# Patient Record
Sex: Female | Born: 1985 | Race: White | Hispanic: No | Marital: Married | Smoking: Never smoker
Health system: Southern US, Community
[De-identification: ages and names within clinical notes are randomized; demographics above are authoritative.]

## PROBLEM LIST (undated history)

## (undated) ENCOUNTER — Inpatient Hospital Stay (HOSPITAL_COMMUNITY): Payer: Self-pay

## (undated) DIAGNOSIS — Z9889 Other specified postprocedural states: Secondary | ICD-10-CM

## (undated) DIAGNOSIS — F32A Depression, unspecified: Secondary | ICD-10-CM

## (undated) DIAGNOSIS — O139 Gestational [pregnancy-induced] hypertension without significant proteinuria, unspecified trimester: Secondary | ICD-10-CM

## (undated) DIAGNOSIS — D649 Anemia, unspecified: Secondary | ICD-10-CM

## (undated) DIAGNOSIS — E049 Nontoxic goiter, unspecified: Secondary | ICD-10-CM

## (undated) DIAGNOSIS — F419 Anxiety disorder, unspecified: Secondary | ICD-10-CM

## (undated) DIAGNOSIS — R112 Nausea with vomiting, unspecified: Secondary | ICD-10-CM

## (undated) HISTORY — DX: Gestational (pregnancy-induced) hypertension without significant proteinuria, unspecified trimester: O13.9

## (undated) HISTORY — PX: DILATION AND CURETTAGE OF UTERUS: SHX78

## (undated) HISTORY — DX: Depression, unspecified: F32.A

## (undated) HISTORY — DX: Nontoxic goiter, unspecified: E04.9

## (undated) HISTORY — PX: LAPAROSCOPY FOR ECTOPIC PREGNANCY: SUR765

---

## 2005-05-24 ENCOUNTER — Observation Stay (HOSPITAL_COMMUNITY): Admission: AD | Admit: 2005-05-24 | Discharge: 2005-05-25 | Payer: Self-pay | Admitting: Obstetrics and Gynecology

## 2005-05-27 ENCOUNTER — Inpatient Hospital Stay (HOSPITAL_COMMUNITY): Admission: AD | Admit: 2005-05-27 | Discharge: 2005-05-27 | Payer: Self-pay | Admitting: Obstetrics and Gynecology

## 2005-05-27 ENCOUNTER — Encounter (INDEPENDENT_AMBULATORY_CARE_PROVIDER_SITE_OTHER): Payer: Self-pay | Admitting: *Deleted

## 2005-05-30 ENCOUNTER — Inpatient Hospital Stay (HOSPITAL_COMMUNITY): Admission: AD | Admit: 2005-05-30 | Discharge: 2005-05-30 | Payer: Self-pay | Admitting: Obstetrics & Gynecology

## 2005-06-03 ENCOUNTER — Encounter (INDEPENDENT_AMBULATORY_CARE_PROVIDER_SITE_OTHER): Payer: Self-pay | Admitting: *Deleted

## 2005-06-03 ENCOUNTER — Ambulatory Visit (HOSPITAL_COMMUNITY): Admission: RE | Admit: 2005-06-03 | Discharge: 2005-06-03 | Payer: Self-pay | Admitting: Obstetrics and Gynecology

## 2005-07-01 HISTORY — PX: ECTOPIC PREGNANCY SURGERY: SHX613

## 2006-04-28 ENCOUNTER — Ambulatory Visit (HOSPITAL_COMMUNITY): Admission: AD | Admit: 2006-04-28 | Discharge: 2006-04-28 | Payer: Self-pay | Admitting: Obstetrics & Gynecology

## 2006-06-29 ENCOUNTER — Inpatient Hospital Stay (HOSPITAL_COMMUNITY): Admission: AD | Admit: 2006-06-29 | Discharge: 2006-06-30 | Payer: Self-pay | Admitting: Obstetrics and Gynecology

## 2006-06-29 ENCOUNTER — Inpatient Hospital Stay (HOSPITAL_COMMUNITY): Admission: AD | Admit: 2006-06-29 | Discharge: 2006-06-29 | Payer: Self-pay | Admitting: Obstetrics and Gynecology

## 2006-07-01 HISTORY — PX: APPENDECTOMY: SHX54

## 2006-07-08 ENCOUNTER — Inpatient Hospital Stay (HOSPITAL_COMMUNITY): Admission: RE | Admit: 2006-07-08 | Discharge: 2006-07-09 | Payer: Self-pay | Admitting: Obstetrics and Gynecology

## 2009-01-31 ENCOUNTER — Ambulatory Visit (HOSPITAL_COMMUNITY): Admission: RE | Admit: 2009-01-31 | Discharge: 2009-01-31 | Payer: Self-pay | Admitting: Radiology

## 2009-02-01 ENCOUNTER — Ambulatory Visit (HOSPITAL_COMMUNITY): Admission: RE | Admit: 2009-02-01 | Discharge: 2009-02-01 | Payer: Self-pay | Admitting: Obstetrics and Gynecology

## 2009-02-01 ENCOUNTER — Encounter (INDEPENDENT_AMBULATORY_CARE_PROVIDER_SITE_OTHER): Payer: Self-pay | Admitting: Obstetrics and Gynecology

## 2010-02-14 ENCOUNTER — Ambulatory Visit (HOSPITAL_COMMUNITY): Admission: RE | Admit: 2010-02-14 | Discharge: 2010-02-14 | Payer: Self-pay | Admitting: Obstetrics and Gynecology

## 2010-10-06 LAB — CBC
HCT: 39.9 % (ref 36.0–46.0)
Hemoglobin: 13.6 g/dL (ref 12.0–15.0)
MCHC: 34.2 g/dL (ref 30.0–36.0)
MCV: 90.5 fL (ref 78.0–100.0)
Platelets: 193 10*3/uL (ref 150–400)
RBC: 4.4 MIL/uL (ref 3.87–5.11)
RDW: 13.2 % (ref 11.5–15.5)
WBC: 6.8 10*3/uL (ref 4.0–10.5)

## 2010-10-06 LAB — RH IMMUNE GLOBULIN WORKUP (NOT WOMEN'S HOSP)
ABO/RH(D): B NEG
Antibody Screen: NEGATIVE

## 2010-10-28 LAB — OB RESULTS CONSOLE HIV ANTIBODY (ROUTINE TESTING): HIV: NONREACTIVE

## 2010-11-13 NOTE — Op Note (Signed)
NAMEERI, MCEVERS             ACCOUNT NO.:  [id number]   MEDICAL RECORD NO.:  [id number]          PATIENT TYPE:  AMB   LOCATION:  SDC                           FACILITY:  [facility]   PHYSICIAN:  Carrington Clamp, M.D. DATE OF BIRTH:  05/15/1986   DATE OF PROCEDURE:  02/01/2009  DATE OF DISCHARGE:                               OPERATIVE REPORT   PREOPERATIVE DIAGNOSIS:  Missed abortion.   POSTOPERATIVE DIAGNOSIS:  Missed abortion.   PROCEDURE:  Dilation and evacuation.   SURGEON:  Carrington Clamp, MD   ASSISTANTS:  None.   ANESTHESIA:  MAC.   FINDINGS:  A 7-week size uterus down to 6 weeks with good cry.   SPECIMEN:  Uterine contents.   DISPOSITION:  To Pathology.   ESTIMATED BLOOD LOSS:  Minimal.   IV FLUIDS:  800 mL.   URINE OUTPUT:  Not measured.   COMPLICATIONS:  None.   MEDICATIONS:  Methergine.   COUNTS:  Correct x3.   TECHNIQUE:  After adequate MAC anesthesia was achieved, the patient was  prepped and draped unremarkable usual sterile fashion in lithotomy  position.  A red rubber catheter was used to empty the bladder, and a  speculum placed in the vagina.  Single-tooth tenaculum was used to  stabilize the cervix.  The cervix was dilated with Shawnie Pons dilators.  An 8-  mm suction curette was placed inside the cavity, and suction curettage  performed without  complication.  Alternating suction curettage and sharp curettage was  used to ensure good cry.  The patient was given IM Methergine, and all  instruments were withdrawn from that the vagina.  The patient tolerated  the procedure well and was returned to recovery in stable condition.      Carrington Clamp, M.D.  Electronically Signed     MH/MEDQ  D:  02/01/2009  T:  02/01/2009  Job:  [id number]

## 2010-11-16 NOTE — Discharge Summary (Signed)
Christine Greene, Christine Greene             ACCOUNT NO.:  [id number]   MEDICAL RECORD NO.:  [id number]          PATIENT TYPE:  INP   LOCATION:  9163                          FACILITY:  [facility]   PHYSICIAN:  Malva Limes, M.D.    DATE OF BIRTH:  11-Mar-1986   DATE OF ADMISSION:  06/29/2006  DATE OF DISCHARGE:  06/30/2006                               DISCHARGE SUMMARY   FINAL DIAGNOSIS:  Intrauterine pregnancy at [redacted] weeks gestation,  threatened labor.   COMPLICATIONS:  None.   This 25 year old G3, P0, 0-2-0 presents at [redacted] weeks gestation in  questionable labor.  She was admitted the evening before, but had no  change in her cervix.  That was felt 2-3 cm dilation upon admission.  She had been in labor and delivery for about 8 hours now and still no  change in cervix, having irregular contractions.  Fetal heart tracing  was nice and reactive without decelerations.  Her antepartum course up  to this point had been uncomplicated.  At this point when there was no  change in the cervix a discussion was held with the patient and her  husband that she is not actually in active labor and Dr. Dareen Piano did  not want to induce at 37 weeks secondary to chance of pulmonary  immaturity.  At this point the patient was allowed to give it another  couple of hours to see there is was any cervical change and there was  not.  She was sent home with some Darvocet and some Ambien to help her  sleep.  Labor precautions were reviewed and she was to follow up with  her next appointment in our office that was already scheduled.   LABS ON DISCHARGE:  The patient had a hemoglobin of 14.4, white blood  cell count of 10.8.      Leilani Able, P.A.-C.    ______________________________  Malva Limes, M.D.    MB/MEDQ  D:  07/28/2006  T:  07/28/2006  Job:  [id number]

## 2010-11-16 NOTE — Op Note (Signed)
NAMEHILLARY, Christine Greene             ACCOUNT NO.:  [id number]   MEDICAL RECORD NO.:  [id number]          PATIENT TYPE:  AMB   LOCATION:  SDC                           [facility]ILITY:  [facility]   PHYSICIAN:  Carrington Clamp, M.D. DATE OF BIRTH:  24-Nov-1985   DATE OF PROCEDURE:  06/03/2005  DATE OF DISCHARGE:                                 OPERATIVE REPORT   PREOPERATIVE DIAGNOSIS:  Ruptured ectopic pregnancy.   POSTOPERATIVE DIAGNOSIS:  Left ectopic pregnancy, ruptured.   PROCEDURES:  Diagnostic laparoscopy with left salpingectomy and irrigation  of abdomen.   SURGEON:  Carrington Clamp, M.D.   ASSISTANT:  Luvenia Redden, M.D.   ANESTHESIA:  Is general.   SPECIMENS:  Left tube.   ESTIMATED BLOOD LOSS:  Minimal during surgery however there was  approximately 600 mL of blood and clot intra-abdominally upon initiation of  the case secondary to the rupture of the ectopic.   IV FLUIDS:  Were 800 mL.   URINE OUTPUT:  Was 50 mL.   COMPLICATIONS:  Were none.   FINDINGS:  Were grossly ruptured left tube. The rupture extended probably  about 3 cm and the bed of the tube was continuing to bleed. Medial edge of  the tube also appeared to be slightly necrotic. Ia suspected that the  ectopic was actually distal to the rupture as there was a continued bulge  distal to the rupture. There was normal right tube and normal ovaries  bilaterally. Counts were correct x2.   MEDICATIONS:  Were none.   TECHNIQUE:  After adequate general anesthesia was achieved, the patient  prepped, draped in sterile fashion in dorsal lithotomy position. A Foley was  placed in the bladder and speculum was placed in the vagina. A uterine  manipulator placed inside the cervix. Speculum was removed. Attention was  turned to the abdomen where an infraumbilical incision of 2 cm made with the  scalpel. The Veress needle was passed into the abdomen without aspiration of  bowel contents or blood and the abdomen insufflated. 10  mm trocar was then  placed into the abdomen without complication and 10 mm scope placed after  that. Two 5 mm incisions were made on either side of the insertion of the  round ligament under direct visualization and 5 mm trocars were placed under  direct visualization the camera. The abdomen was irrigated and as much clot  and blood was attempted to be removed as possible. The left tubal rupture  was identified and after inspecting carefully, it was decided that it was  impossible save the tube. Salpingectomy proceeded with the tripolar cautery  medial to the edge of the rupture but distal to the cornua. The cautery  proceeded along the mesosalpinx, removing the entire tube. Hemostasis was  achieved and the ovary was left alone intact.   More irrigation was performed and the incision lines found to be hemostatic.  All instruments were then withdrawn from the abdomen and the abdomen  desufflated. The fascial incision of the 10 mm incision was closed with a  figure-of-eight stitch of 2-0 Vicryl. 3-0 Vicryl Rapide was used to  close  the smaller skin incisions in subcuticular fashion. The skin incision then  closed with Dermabond. The patient tolerated the procedure well and was  returned to recovery room in stable condition.      Carrington Clamp, M.D.  Electronically Signed     MH/MEDQ  D:  06/03/2005  T:  06/03/2005  Job:  [id number]

## 2011-07-02 NOTE — L&D Delivery Note (Signed)
Pt had Pitocin augmentation after she had SROM. She completed the first stage without difficulty. When complete she was taken to the OR, Second stage was brief. She delivered Twin A, a boy, in the LOA position. Twin B, a girl, was delivered in the ROP position, Pt had a 2nd degree midline tear. Placenta S/I. Tear closed with 3-0 chromic. EBL-400cc. Babies to NICU. No complications.

## 2011-10-25 ENCOUNTER — Inpatient Hospital Stay (HOSPITAL_COMMUNITY)
Admission: AD | Admit: 2011-10-25 | Discharge: 2011-10-27 | DRG: 379 | Disposition: A | Discharge status: Home / Self Care | Payer: BC Managed Care – PPO | Source: Ambulatory Visit | Attending: Obstetrics & Gynecology | Admitting: Obstetrics & Gynecology

## 2011-10-25 ENCOUNTER — Encounter (HOSPITAL_COMMUNITY): Payer: Self-pay | Admitting: *Deleted

## 2011-10-25 DIAGNOSIS — O4700 False labor before 37 completed weeks of gestation, unspecified trimester: Principal | ICD-10-CM | POA: Diagnosis present

## 2011-10-25 DIAGNOSIS — O30009 Twin pregnancy, unspecified number of placenta and unspecified number of amniotic sacs, unspecified trimester: Secondary | ICD-10-CM | POA: Diagnosis present

## 2011-10-25 DIAGNOSIS — O30049 Twin pregnancy, dichorionic/diamniotic, unspecified trimester: Secondary | ICD-10-CM

## 2011-10-25 LAB — FETAL FIBRONECTIN: Fetal Fibronectin: POSITIVE — AB

## 2011-10-25 MED ORDER — BETAMETHASONE SOD PHOS & ACET 6 (3-3) MG/ML IJ SUSP
12.0000 mg | INTRAMUSCULAR | Status: AC
  Administered 2011-10-25 – 2011-10-26 (×2): 12 mg via INTRAMUSCULAR
  Filled 2011-10-25 (×2): qty 2

## 2011-10-25 MED ORDER — LACTATED RINGERS IV SOLN
INTRAVENOUS | Status: DC
  Administered 2011-10-25 – 2011-10-27 (×5): via INTRAVENOUS

## 2011-10-25 MED ORDER — MAGNESIUM SULFATE BOLUS VIA INFUSION
6.0000 g | Freq: Once | INTRAVENOUS | Status: AC
  Administered 2011-10-25: 6 g via INTRAVENOUS
  Filled 2011-10-25: qty 500

## 2011-10-25 MED ORDER — DOCUSATE SODIUM 100 MG PO CAPS
100.0000 mg | ORAL_CAPSULE | Freq: Every day | ORAL | Status: DC
  Administered 2011-10-26 – 2011-10-27 (×2): 100 mg via ORAL
  Filled 2011-10-25 (×2): qty 1

## 2011-10-25 MED ORDER — CALCIUM CARBONATE ANTACID 500 MG PO CHEW
2.0000 | CHEWABLE_TABLET | ORAL | Status: DC | PRN
  Administered 2011-10-26 (×2): 400 mg via ORAL
  Filled 2011-10-25 (×2): qty 2

## 2011-10-25 MED ORDER — PRENATAL MULTIVITAMIN CH
1.0000 | ORAL_TABLET | Freq: Every day | ORAL | Status: DC
  Administered 2011-10-26 – 2011-10-27 (×2): 1 via ORAL
  Filled 2011-10-25 (×2): qty 1

## 2011-10-25 MED ORDER — SODIUM CHLORIDE 0.9 % IV SOLN
2.0000 g | Freq: Four times a day (QID) | INTRAVENOUS | Status: DC
  Administered 2011-10-25 – 2011-10-27 (×8): 2 g via INTRAVENOUS
  Filled 2011-10-25 (×8): qty 2000

## 2011-10-25 MED ORDER — BUTORPHANOL TARTRATE 2 MG/ML IJ SOLN
1.0000 mg | Freq: Once | INTRAMUSCULAR | Status: AC
  Administered 2011-10-26: 1 mg via INTRAVENOUS
  Filled 2011-10-25: qty 1

## 2011-10-25 MED ORDER — PANTOPRAZOLE SODIUM 40 MG PO TBEC
80.0000 mg | DELAYED_RELEASE_TABLET | Freq: Every day | ORAL | Status: DC
  Administered 2011-10-26 – 2011-10-27 (×2): 80 mg via ORAL
  Filled 2011-10-25 (×3): qty 2

## 2011-10-25 MED ORDER — ACETAMINOPHEN 325 MG PO TABS
650.0000 mg | ORAL_TABLET | ORAL | Status: DC | PRN
  Administered 2011-10-25 – 2011-10-27 (×5): 650 mg via ORAL
  Filled 2011-10-25 (×5): qty 2

## 2011-10-25 MED ORDER — ZOLPIDEM TARTRATE 10 MG PO TABS
10.0000 mg | ORAL_TABLET | Freq: Every evening | ORAL | Status: DC | PRN
  Administered 2011-10-25 – 2011-10-26 (×2): 10 mg via ORAL
  Filled 2011-10-25 (×2): qty 1

## 2011-10-25 MED ORDER — MAGNESIUM SULFATE 40 G IN LACTATED RINGERS - SIMPLE
3.0000 g/h | INTRAVENOUS | Status: DC
  Administered 2011-10-26: 3 g/h via INTRAVENOUS
  Filled 2011-10-25 (×2): qty 500

## 2011-10-25 MED ORDER — ONDANSETRON 8 MG PO TBDP
8.0000 mg | ORAL_TABLET | Freq: Three times a day (TID) | ORAL | Status: DC | PRN
  Administered 2011-10-25 – 2011-10-26 (×3): 8 mg via ORAL
  Filled 2011-10-25 (×2): qty 1

## 2011-10-25 NOTE — Consult Note (Signed)
[facility]  Neonatal Medicine Consultation       10/25/2011    9:34 PM  I was called at the request of the patient's obstetrician (Dr. Arlyce Dice) to speak to this patient due to preterm labor at 32 weeks.  Per Dr. Marzetta Board note:  "26 y.o. [id number] Estimated Date of Delivery: 12/20/11 admitted at [redacted] weeks gestation for preterm labor.   Prenatal course was complicated by Di/Di Twin gestation  Prenatal labs: Blood Type:B-(Received RhoGAM at 28 weeks). Screening tests for HIV, Syphilis, Hepatitis B, Rubella sensitivity, fetal anomalies, and gestational diabetes were negative. "  I spoke with the patient regarding expectations for 32-35 week newborns.  We anticipate the babies (female and female) will require NICU care, but have an uncomplicated hospital course and good outcome.  I reviewed respiratory and feeding issues, temperature support, length of stay.  She plans to breast feed the twins, and I discussed the breast pump she will need to use for several weeks.  _____________________ Electronically Signed By: Angelita Ingles, MD Neonatologist

## 2011-10-25 NOTE — H&P (Signed)
  26 y.o. [id number]  Estimated Date of Delivery: 12/20/11 admitted at32 weeks gestation for preterm labor. Prenatal course was complicated by Di/Di Twin gestation Prenatal labs: Blood Type:B-(Received RhoGAM at 28 weeks).  Screening tests for HIV, Syphilis, Hepatitis B, Rubella sensitivity, fetal anomalies, and gestational diabetes were negative.    Afebrile, VSS Heart and Lungs: No active disease Abdomen: soft, gravid, Twins AGA. Cervical exam:  3/60.  Impression: Preterm cervical change in twin gestation  Plan:  Steroids, antibiotics, magnesium tocolysis and cerebral palsy prophylaxis if contractions are noted.

## 2011-10-26 MED ORDER — MAGNESIUM SULFATE 40 G IN LACTATED RINGERS - SIMPLE
2.5000 g/h | INTRAVENOUS | Status: DC
  Filled 2011-10-26: qty 500

## 2011-10-26 NOTE — Progress Notes (Addendum)
Patient was begun on IV Magnesium shortly after admission last night due to moderate uterine contractions occuring every 3 - 4 minutes.  Her uterine activity has decreased on magnesium 3 gms/hour.  Reflexes remain active.  Currently she is having about 3 mild contractions per hour.  The twins show no significant FHT abnormalities.  Plan:  Change hospital status from observation to in patient.            Continue magnesium tocolysis.

## 2011-10-26 NOTE — Progress Notes (Signed)
Magnesium sulfate decreased to 2gm/hr. 

## 2011-10-26 NOTE — Progress Notes (Signed)
1615- magnesium sulfate decreased to 1.5 grams per hour.

## 2011-10-26 NOTE — Progress Notes (Signed)
0830 magnesium sulfate decreased to 2.5gm/hr as ordered by dr. Arlyce Dice

## 2011-10-26 NOTE — Progress Notes (Signed)
Unable to definitively distinguish baby b fhr & maternal hr via external cardio and maternal pulse ox by myself and Ruthe F., RN- called Dr. Arlyce Dice to perform bedside u/s.  Dr. Arlyce Dice in pt's room @ 0045, bedside u/s performed, fhr present x 2; baby a vtx on maternal Lt with fhr 120s, baby b vtx on maternal Rt with fhr low 100's- correlating with maternal hr. Mom & husband reassured of fetal wellbeing x 2 by Dr. Arlyce Dice. Cheral Marker

## 2011-10-27 MED ORDER — NIFEDIPINE 10 MG PO CAPS
20.0000 mg | ORAL_CAPSULE | Freq: Three times a day (TID) | ORAL | Status: DC
  Administered 2011-10-27: 20 mg via ORAL
  Filled 2011-10-27: qty 2

## 2011-10-27 NOTE — Discharge Instructions (Signed)
Preventing Preterm Labor Preterm labor is when a pregnant woman has contractions that cause the cervix to open, shorten, and thin before 37 weeks of pregnancy. You will have regular contractions (tightening) 2 to 3 minutes apart. This usually causes discomfort or pain. HOME CARE  Eat a healthy diet.   Take your vitamins as told by your doctor.   Drink enough fluids to keep your pee (urine) clear or pale yellow every day.   Get rest and sleep.   Do not have sex if you are at high risk for preterm labor.   Follow your doctor's advice about activity, medicines, and tests.   Avoid stress.   Avoid hard labor or exercise that lasts for a long time.   Do not smoke.  GET HELP RIGHT AWAY IF:   You are having contractions.   You have belly (abdominal) pain.   You have bleeding from your vagina.   You have pain when you pee (urinate).   You have abnormal discharge from your vagina.   You have a temperature by mouth above 102 F (38.9 C).  MAKE SURE YOU:  Understand these instructions.   Will watch your condition.   Will get help if you are not doing well or get worse.  Document Released: 09/13/2008 Document Revised: 06/06/2011 Document Reviewed: 09/13/2008 [employer] Patient Information 2012 [employer]. 

## 2011-10-27 NOTE — Progress Notes (Signed)
S: Patient feels OK.  Does not feel uterine activity.  She did complain of chest pain after taking procardia this morning.  Patient wants to go home if possible.  O:  Pulse 116, VS otherwise stable.  Contractions less than 1 -2 per hour.  IV magnesium was tapered down during the day yesterday and was discontinued at 0400 this morning.  I performed a very gentle cervical exam this morning and felt that the cervix was no more than 50 percent effaced.  I did not probe or stretch the internal os but doubt that it is more than the 3 cm dilation noted on admission.  A: Preterm labor with twins -resolved.  Steroid prophylaxis given.  GBS result still pending.  P: Will allow patient to shower and observe for a few more hours.  If stable will discharge home on bedrest.  I will not treat with oral tocolytics due to side effects and lack of proven efficacy in delaying delivery.  She will return to office in 5 days for follow up.

## 2011-10-27 NOTE — Discharge Summary (Signed)
Discharge Diagnosis: Preterm Labor. Undelivered Secondary Diagnosis: Twin pregnancy, [redacted] week gestation Procedures: IV magnesium tocolysis Condition on discharge: Improved  Hospital course:  Admitted from office with cervical change - 3 cm 60% effacement.  Tocometry confirmed uterine contractions.  Patient treated with IV magnesium at 3 gm/hour to inhibit contractions.  She received Betamethasone at 5 pm 4/26 and 4.27.  She was given IV Ampicillin for GBS prophylaxis (GBS culture done on admission is still pending). Tocolysis was successful and her exam today prior to discharge confirmed that her cervix had no changed from admission.  She was given Procardia p.o. but complained of chest pain after the dose.  Because of this side effect and the lack of evidence that oral tocolytics are effective in prolonging pregnancy, she was not given oral tocolytics on discharge.  She was discharged on a regular diet, asked to minimize her activity, and to keep her already scheduled office visit for 5/2.

## 2011-10-28 NOTE — Progress Notes (Signed)
Post discharge review completed. 

## 2011-10-29 LAB — CULTURE, BETA STREP (GROUP B ONLY)

## 2011-11-01 ENCOUNTER — Inpatient Hospital Stay (HOSPITAL_COMMUNITY): Payer: BC Managed Care – PPO

## 2011-11-01 ENCOUNTER — Inpatient Hospital Stay (HOSPITAL_COMMUNITY)
Admission: AD | Admit: 2011-11-01 | Discharge: 2011-11-07 | DRG: 372 | Disposition: A | Discharge status: Home / Self Care | Payer: BC Managed Care – PPO | Source: Ambulatory Visit | Attending: Obstetrics and Gynecology | Admitting: Obstetrics and Gynecology

## 2011-11-01 DIAGNOSIS — O30009 Twin pregnancy, unspecified number of placenta and unspecified number of amniotic sacs, unspecified trimester: Principal | ICD-10-CM | POA: Diagnosis present

## 2011-11-01 DIAGNOSIS — Z22330 Carrier of Group B streptococcus: Secondary | ICD-10-CM

## 2011-11-01 DIAGNOSIS — O30049 Twin pregnancy, dichorionic/diamniotic, unspecified trimester: Secondary | ICD-10-CM

## 2011-11-01 DIAGNOSIS — O99892 Other specified diseases and conditions complicating childbirth: Secondary | ICD-10-CM | POA: Diagnosis present

## 2011-11-01 DIAGNOSIS — Z9889 Other specified postprocedural states: Secondary | ICD-10-CM

## 2011-11-01 LAB — OB RESULTS CONSOLE GC/CHLAMYDIA
Chlamydia: NEGATIVE
Gonorrhea: NEGATIVE

## 2011-11-01 LAB — OB RESULTS CONSOLE HIV ANTIBODY (ROUTINE TESTING): HIV: NONREACTIVE

## 2011-11-01 MED ORDER — ZOLPIDEM TARTRATE 10 MG PO TABS
10.0000 mg | ORAL_TABLET | Freq: Every evening | ORAL | Status: DC | PRN
  Administered 2011-11-01 – 2011-11-05 (×3): 10 mg via ORAL
  Filled 2011-11-01 (×4): qty 1

## 2011-11-01 MED ORDER — DOCUSATE SODIUM 100 MG PO CAPS
100.0000 mg | ORAL_CAPSULE | Freq: Every day | ORAL | Status: DC
  Administered 2011-11-01 – 2011-11-03 (×3): 100 mg via ORAL
  Filled 2011-11-01 (×3): qty 1

## 2011-11-01 MED ORDER — TERBUTALINE SULFATE 1 MG/ML IJ SOLN
0.2500 mg | Freq: Once | INTRAMUSCULAR | Status: DC | PRN
  Filled 2011-11-01: qty 1

## 2011-11-01 MED ORDER — BETAMETHASONE SOD PHOS & ACET 6 (3-3) MG/ML IJ SUSP
12.0000 mg | INTRAMUSCULAR | Status: AC
Start: 2011-11-01 — End: 2011-11-01
  Administered 2011-11-01: 12 mg via INTRAMUSCULAR
  Filled 2011-11-01: qty 2

## 2011-11-01 MED ORDER — CALCIUM CARBONATE ANTACID 500 MG PO CHEW
2.0000 | CHEWABLE_TABLET | ORAL | Status: DC | PRN
  Administered 2011-11-02: 400 mg via ORAL
  Filled 2011-11-01: qty 2

## 2011-11-01 MED ORDER — PRENATAL MULTIVITAMIN CH
1.0000 | ORAL_TABLET | Freq: Every day | ORAL | Status: DC
  Administered 2011-11-02 – 2011-11-03 (×2): 1 via ORAL
  Filled 2011-11-01 (×3): qty 1

## 2011-11-01 MED ORDER — ACETAMINOPHEN 325 MG PO TABS
650.0000 mg | ORAL_TABLET | ORAL | Status: DC | PRN
  Administered 2011-11-01 – 2011-11-02 (×2): 650 mg via ORAL
  Filled 2011-11-01 (×2): qty 2

## 2011-11-01 MED ORDER — NIFEDIPINE 10 MG PO CAPS
10.0000 mg | ORAL_CAPSULE | Freq: Four times a day (QID) | ORAL | Status: DC
  Administered 2011-11-01 – 2011-11-02 (×4): 10 mg via ORAL
  Filled 2011-11-01 (×4): qty 1

## 2011-11-01 MED ORDER — NIFEDIPINE 10 MG PO CAPS
10.0000 mg | ORAL_CAPSULE | Freq: Once | ORAL | Status: AC
  Administered 2011-11-01: 10 mg via ORAL
  Filled 2011-11-01: qty 1

## 2011-11-01 NOTE — Progress Notes (Signed)
26 y.o. [id number] [redacted]w[redacted]d HD#0 admitted for 33WKS,TWINS,CTX.  Called to see pt for increase in ctxes.  Now irritability with more ctxes and feeling a lot of pressure.  Good FM.  Filed Vitals:   11/01/11 2000  BP: 123/72  Pulse: 107  Temp: 98.4 F (36.9 C)  Resp: 20     Lungs CTA Cor RRR Abd  Soft, gravid, nontender Ex SCDs FHTs  A 140s, B 140s, both good short term variability, NST R Toco  q2-5 SVE no change from 3/70/-3  Results for orders placed during the hospital encounter of 11/01/11 (from the past 24 hour(s))  OB RESULTS CONSOLE GBS     Status: Normal      Component Value Range   GBS Positive    OB RESULTS CONSOLE GC/CHLAMYDIA     Status: Normal      Component Value Range   Gonorrhea Negative     Chlamydia Negative    OB RESULTS CONSOLE HIV ANTIBODY (ROUTINE TESTING)     Status: Normal      Component Value Range   HIV Non-reactive      A:  HD#0  [redacted]w[redacted]d with preterm ctxes.  P: Pt just given procardia.  If ctxes do not slow and become less painful in next half hour, will start MgSO4 for 24 hrs post rescue dose of steroids.  Damarco Keysor A

## 2011-11-01 NOTE — H&P (Signed)
26 y.o. [redacted]w[redacted]d  Z6X0960 with Di/Di twins comes in c/o ctxes.  Otherwise has good fetal movement and no bleeding.  No past medical history on file.  Past Surgical History  Procedure Date  . Dilation and curettage of uterus   . Laparoscopy for ectopic pregnancy     OB History    Grav Para Term Preterm Abortions TAB SAB Ect Mult Living   7 1 1  5  5   1      # Outc Date GA Lbr Len/2nd Wgt Sex Del Anes PTL Lv   1 TRM            2 SAB            3 SAB            4 SAB            5 SAB            6 SAB            7 CUR               History   Social History  . Marital Status: Married    Spouse Name: N/A    Number of Children: N/A  . Years of Education: N/A   Occupational History  . Not on file.   Social History Main Topics  . Smoking status: Never Smoker   . Smokeless tobacco: Never Used  . Alcohol Use: No  . Drug Use: No  . Sexually Active: Not Currently   Other Topics Concern  . Not on file   Social History Narrative  . No narrative on file   Hydrocodone   Prenatal Course:  Twins, to date concordant and good growth.  Pt admitted 4-26 for PTL and received BMZ and magnesium.  Pt was unable to tolerate procardia.  Filed Vitals:   11/01/11 1513  BP: 132/82  Pulse: 135  Temp: 98.2 F (36.8 C)  Resp: 24     Lungs/Cor:  NAD Abdomen:  soft, gravid Ex:  no cords, erythema SVE:  3/70/-2 vtx FHTs:  A 150s B 160s , both good STV, NST R Toco:  q10   A/P   [redacted]w[redacted]d Twins, possible PTL.  GBS positive.  Will give second shot of BMZ (1 week later) and evaluate for cervical change.  Mcdaniel Ohms A

## 2011-11-02 ENCOUNTER — Encounter (HOSPITAL_COMMUNITY): Payer: Self-pay | Admitting: *Deleted

## 2011-11-02 MED ORDER — BUTORPHANOL TARTRATE 2 MG/ML IJ SOLN
INTRAMUSCULAR | Status: AC
  Administered 2011-11-02: 18:00:00
  Filled 2011-11-02: qty 1

## 2011-11-02 MED ORDER — NIFEDIPINE 10 MG PO CAPS: 10.0000 mg | ORAL_CAPSULE | Freq: Four times a day (QID) | ORAL | Status: DC | PRN

## 2011-11-02 MED ORDER — PENICILLIN G POTASSIUM 5000000 UNITS IJ SOLR
2.5000 10*6.[IU] | INTRAVENOUS | Status: DC
  Administered 2011-11-02 – 2011-11-03 (×4): 2.5 10*6.[IU] via INTRAVENOUS
  Filled 2011-11-02 (×7): qty 2.5

## 2011-11-02 MED ORDER — PENICILLIN G POTASSIUM 5000000 UNITS IJ SOLR
5.0000 10*6.[IU] | Freq: Once | INTRAVENOUS | Status: AC
  Administered 2011-11-02: 5 10*6.[IU] via INTRAVENOUS
  Filled 2011-11-02: qty 5

## 2011-11-02 MED ORDER — PANTOPRAZOLE SODIUM 40 MG PO TBEC
80.0000 mg | DELAYED_RELEASE_TABLET | Freq: Every day | ORAL | Status: DC
  Administered 2011-11-02 – 2011-11-05 (×3): 80 mg via ORAL
  Filled 2011-11-02 (×5): qty 2

## 2011-11-02 MED ORDER — BUTORPHANOL TARTRATE 2 MG/ML IJ SOLN
2.0000 mg | Freq: Once | INTRAMUSCULAR | Status: AC
  Administered 2011-11-02: 2 mg via INTRAVENOUS

## 2011-11-02 MED ORDER — MAGNESIUM SULFATE BOLUS VIA INFUSION
2.0000 g | Freq: Once | INTRAVENOUS | Status: AC
  Administered 2011-11-02: 2 g via INTRAVENOUS
  Filled 2011-11-02: qty 500

## 2011-11-02 MED ORDER — SODIUM CHLORIDE 0.9 % IJ SOLN
3.0000 mL | Freq: Two times a day (BID) | INTRAMUSCULAR | Status: DC
  Administered 2011-11-02: 3 mL via INTRAVENOUS

## 2011-11-02 MED ORDER — ONDANSETRON 8 MG/NS 50 ML IVPB
8.0000 mg | Freq: Three times a day (TID) | INTRAVENOUS | Status: DC | PRN
  Administered 2011-11-02 – 2011-11-05 (×3): 8 mg via INTRAVENOUS
  Filled 2011-11-02 (×3): qty 8

## 2011-11-02 MED ORDER — LACTATED RINGERS IV SOLN
INTRAVENOUS | Status: DC
  Administered 2011-11-02 – 2011-11-03 (×2): via INTRAVENOUS

## 2011-11-02 MED ORDER — BUTORPHANOL TARTRATE 2 MG/ML IJ SOLN
2.0000 mg | Freq: Once | INTRAMUSCULAR | Status: AC
  Administered 2011-11-02: 2 mg via INTRAVENOUS
  Filled 2011-11-02: qty 1

## 2011-11-02 MED ORDER — MAGNESIUM SULFATE 40 G IN LACTATED RINGERS - SIMPLE: 2.0000 g/h | INTRAVENOUS | Status: DC

## 2011-11-02 MED ORDER — MAGNESIUM SULFATE 40 MG/ML IJ SOLN: 2.0000 g | Freq: Once | INTRAMUSCULAR | Status: DC

## 2011-11-02 MED ORDER — MAGNESIUM SULFATE 40 G IN LACTATED RINGERS - SIMPLE
2.0000 g/h | INTRAVENOUS | Status: DC
  Administered 2011-11-02: 2 g/h via INTRAVENOUS
  Filled 2011-11-02 (×2): qty 500

## 2011-11-02 NOTE — Progress Notes (Signed)
26 y.o. [id number] [redacted]w[redacted]d HD#1 admitted for 33WKS,TWINS,CTX.  Pt currently c/o more intense contractions; clearly uncomfortable with them.  Procardia given about 1 1/2 hours ago and not slowing them.  Good FM x 2.  Filed Vitals:   11/02/11 0824  BP: 112/74  Pulse: 110  Temp: 97.9 F (36.6 C)  Resp: 20   U/S Vtx/Vtx; A 2070 gm, B 1857 gm; 10 % discord.  Fluid normal.  Cervix 1.04 cm translabially.  Lungs CTA Cor RRR Abd  Soft, gravid, nontender Ex SCDs FHTs  A 120s B 140s, both good short term variability, NST R Toco  6-10 SVE loose 3/70/-2  No results found for this or any previous visit (from the past 24 hour(s)).  A:  HD#1  [redacted]w[redacted]d with preterm ctxes and PTL; s/p rescue dose of BMZ.  Small amount of cervical change and continued ctxes that are getting more intense even on Procardia.  P: 1.  Still less than 24 hours p rescue dose- start Magnesium sulfate and PCN. 2.  Bedrest, SCDs; ok for regular diet now.    Adelee Hannula A

## 2011-11-03 MED ORDER — SODIUM CHLORIDE 0.9 % IJ SOLN
3.0000 mL | Freq: Two times a day (BID) | INTRAMUSCULAR | Status: DC
  Administered 2011-11-03 – 2011-11-04 (×3): 3 mL via INTRAVENOUS

## 2011-11-03 MED ORDER — BUTORPHANOL TARTRATE 2 MG/ML IJ SOLN
2.0000 mg | Freq: Once | INTRAMUSCULAR | Status: AC
  Administered 2011-11-03: 2 mg via INTRAVENOUS
  Filled 2011-11-03: qty 1

## 2011-11-03 MED ORDER — LACTATED RINGERS IV SOLN
INTRAVENOUS | Status: DC | PRN
  Administered 2011-11-03: 23:00:00 via INTRAVENOUS

## 2011-11-03 MED ORDER — NIFEDIPINE 10 MG PO CAPS
10.0000 mg | ORAL_CAPSULE | Freq: Four times a day (QID) | ORAL | Status: DC
  Administered 2011-11-03 – 2011-11-04 (×4): 10 mg via ORAL
  Filled 2011-11-03 (×4): qty 1

## 2011-11-03 NOTE — Progress Notes (Signed)
26 y.o. Z6X0960 [redacted]w[redacted]d HD#2 admitted for 33WKS,TWINS,CTX.  Pt currently stable with no c/o; slept o/n.  Good FM.  Filed Vitals:   11/03/11 0333  BP: 116/66  Pulse: 91  Temp: 98 F (36.7 C)  Resp: 20     Lungs CTA Cor RRR Abd  Soft, gravid, nontender Ex SCDs FHTs  A120s, B 130s, both good short term variability, NST R Toco  q20-30  No results found for this or any previous visit (from the past 24 hour(s)).  A:  HD#2  [redacted]w[redacted]d with PTL.  P: D/C magnesium and antibiotics.  S/P rescue dose of BMZ.  Tonnie Stillman A

## 2011-11-03 NOTE — Progress Notes (Signed)
Ctxes are kept at bay with Procardia; occasionally ctxes are harder.  Pt is very worried about going home; she lives in [place].  I believe keeping her O/N is reasonable to make sure labor does not restart and that she is comfortable on Procardia.

## 2011-11-04 ENCOUNTER — Inpatient Hospital Stay (HOSPITAL_COMMUNITY): Payer: BC Managed Care – PPO | Admitting: Anesthesiology

## 2011-11-04 ENCOUNTER — Encounter (HOSPITAL_COMMUNITY): Payer: Self-pay | Admitting: *Deleted

## 2011-11-04 ENCOUNTER — Encounter (HOSPITAL_COMMUNITY): Payer: Self-pay | Admitting: Anesthesiology

## 2011-11-04 LAB — CBC
Platelets: 125 10*3/uL — ABNORMAL LOW (ref 150–400)
RBC: 3.59 MIL/uL — ABNORMAL LOW (ref 3.87–5.11)
WBC: 8.8 10*3/uL (ref 4.0–10.5)

## 2011-11-04 LAB — RPR: RPR Ser Ql: NONREACTIVE

## 2011-11-04 LAB — AMNISURE RUPTURE OF MEMBRANE (ROM) NOT AT ARMC: Amnisure ROM: NEGATIVE

## 2011-11-04 MED ORDER — OXYTOCIN 20 UNITS IN LACTATED RINGERS INFUSION - SIMPLE
125.0000 mL/h | Freq: Once | INTRAVENOUS | Status: DC
  Administered 2011-11-05: 999 mL/h via INTRAVENOUS
  Filled 2011-11-04 (×2): qty 1000

## 2011-11-04 MED ORDER — ONDANSETRON HCL 4 MG/2ML IJ SOLN
4.0000 mg | Freq: Four times a day (QID) | INTRAMUSCULAR | Status: DC | PRN
  Administered 2011-11-04 (×2): 4 mg via INTRAVENOUS
  Filled 2011-11-04 (×2): qty 2

## 2011-11-04 MED ORDER — LACTATED RINGERS IV SOLN: 500.0000 mL | Freq: Once | INTRAVENOUS | Status: DC

## 2011-11-04 MED ORDER — LACTATED RINGERS IV SOLN
INTRAVENOUS | Status: DC
  Administered 2011-11-04 – 2011-11-05 (×5): via INTRAVENOUS

## 2011-11-04 MED ORDER — PHENYLEPHRINE 40 MCG/ML (10ML) SYRINGE FOR IV PUSH (FOR BLOOD PRESSURE SUPPORT): 80.0000 ug | PREFILLED_SYRINGE | INTRAVENOUS | Status: DC | PRN

## 2011-11-04 MED ORDER — SODIUM CHLORIDE 0.9 % IV SOLN
2.0000 g | Freq: Four times a day (QID) | INTRAVENOUS | Status: DC
  Administered 2011-11-04 – 2011-11-05 (×6): 2 g via INTRAVENOUS
  Filled 2011-11-04 (×9): qty 2000

## 2011-11-04 MED ORDER — DIPHENHYDRAMINE HCL 50 MG/ML IJ SOLN
12.5000 mg | INTRAMUSCULAR | Status: DC | PRN
  Administered 2011-11-04: 12.5 mg via INTRAVENOUS
  Filled 2011-11-04: qty 1

## 2011-11-04 MED ORDER — FLEET ENEMA 7-19 GM/118ML RE ENEM: 1.0000 | ENEMA | RECTAL | Status: DC | PRN

## 2011-11-04 MED ORDER — OXYCODONE-ACETAMINOPHEN 5-325 MG PO TABS: 1.0000 | ORAL_TABLET | ORAL | Status: DC | PRN

## 2011-11-04 MED ORDER — FENTANYL 2.5 MCG/ML BUPIVACAINE 1/10 % EPIDURAL INFUSION (WH - ANES)
INTRAMUSCULAR | Status: DC | PRN
  Administered 2011-11-04: 14 mL/h via EPIDURAL

## 2011-11-04 MED ORDER — SODIUM BICARBONATE 8.4 % IV SOLN
INTRAVENOUS | Status: DC | PRN
  Administered 2011-11-04: 4 mL via EPIDURAL

## 2011-11-04 MED ORDER — LACTATED RINGERS IV SOLN: 500.0000 mL | INTRAVENOUS | Status: DC | PRN

## 2011-11-04 MED ORDER — PHENYLEPHRINE 40 MCG/ML (10ML) SYRINGE FOR IV PUSH (FOR BLOOD PRESSURE SUPPORT)
80.0000 ug | PREFILLED_SYRINGE | INTRAVENOUS | Status: DC | PRN
  Filled 2011-11-04 (×2): qty 5

## 2011-11-04 MED ORDER — BUTORPHANOL TARTRATE 2 MG/ML IJ SOLN
1.0000 mg | INTRAMUSCULAR | Status: DC | PRN
  Administered 2011-11-04 (×2): 1 mg via INTRAVENOUS
  Filled 2011-11-04 (×2): qty 1

## 2011-11-04 MED ORDER — EPHEDRINE 5 MG/ML INJ
10.0000 mg | INTRAVENOUS | Status: DC | PRN
  Filled 2011-11-04: qty 4

## 2011-11-04 MED ORDER — IBUPROFEN 600 MG PO TABS: 600.0000 mg | ORAL_TABLET | Freq: Four times a day (QID) | ORAL | Status: DC | PRN

## 2011-11-04 MED ORDER — OXYTOCIN BOLUS FROM INFUSION
500.0000 mL | Freq: Once | INTRAVENOUS | Status: DC
  Filled 2011-11-04: qty 500

## 2011-11-04 MED ORDER — FENTANYL 2.5 MCG/ML BUPIVACAINE 1/10 % EPIDURAL INFUSION (WH - ANES)
14.0000 mL/h | INTRAMUSCULAR | Status: DC
  Administered 2011-11-04 – 2011-11-05 (×8): 14 mL/h via EPIDURAL
  Filled 2011-11-04 (×10): qty 60

## 2011-11-04 MED ORDER — LIDOCAINE HCL (PF) 1 % IJ SOLN
30.0000 mL | INTRAMUSCULAR | Status: DC | PRN
  Filled 2011-11-04: qty 30

## 2011-11-04 MED ORDER — CITRIC ACID-SODIUM CITRATE 334-500 MG/5ML PO SOLN: 30.0000 mL | ORAL | Status: DC | PRN

## 2011-11-04 MED ORDER — ACETAMINOPHEN 325 MG PO TABS: 650.0000 mg | ORAL_TABLET | ORAL | Status: DC | PRN

## 2011-11-04 NOTE — Anesthesia Procedure Notes (Signed)

## 2011-11-04 NOTE — Progress Notes (Signed)
Patient ID: Christine Greene, female   DOB: 12/03/1985, 26 y.o.   MRN: [id number]  S:Pt stated to RN that she felt wet. RN checked pt and no longer felt bulging bag and was concerned pt PPROM.  No fluid noted on towel or perineum. O: AFVSS NEFG, dry perineum Vagina with mucoid d/c, no pool, no LOF cvx 5/80/-1, bag palpated intact FHT 130-140 x 2 reactive with accels, rare variable decels toco Q4 A/P 1) 33+3 week vtx/vtx twins with preterm contractions. Pt received epidural this morning for severe pain with contractions and pressure.  She's had no cervical change since my exam this morning despite multiple exams for c/o leaking fluid and pressure. Discussed with patient and her husband the importance for keeping the babies in utero as long as possible.  Advised against augmentation of labor or AROM unless clinically indicated due to PPROM, advancing cervical dilation, or NRFWB.   2) FWB reassuring

## 2011-11-04 NOTE — Progress Notes (Signed)
Dr Henderson Cloud notified of sve, uc pattern, and complaint of leaking fld.  Made aware that rn had small amt of fld on glove with last cervical exam.  Md gives order for Christine Greene, [place] and says that pt may have epidural if she is ruptured

## 2011-11-04 NOTE — Anesthesia Preprocedure Evaluation (Addendum)
Anesthesia Evaluation  Patient identified by MRN, date of birth, ID band Patient awake    Reviewed: Allergy & Precautions, H&P , Patient's Chart, lab work & pertinent test results  Airway Mallampati: III TM Distance: >3 FB Neck ROM: full    Dental  (+) Teeth Intact   Pulmonary  breath sounds clear to auscultation        Cardiovascular Rhythm:regular Rate:Normal     Neuro/Psych    GI/Hepatic GERD-  ,  Endo/Other    Renal/GU      Musculoskeletal   Abdominal   Peds  Hematology   Anesthesia Other Findings       Reproductive/Obstetrics (+) Pregnancy                          Anesthesia Physical Anesthesia Plan  ASA: II  Anesthesia Plan: Epidural   Post-op Pain Management:    Induction:   Airway Management Planned:   Additional Equipment:   Intra-op Plan:   Post-operative Plan:   Informed Consent: I have reviewed the patients History and Physical, chart, labs and discussed the procedure including the risks, benefits and alternatives for the proposed anesthesia with the patient or authorized representative who has indicated his/her understanding and acceptance.   Dental Advisory Given  Plan Discussed with:   Anesthesia Plan Comments: (Labs checked- platelets confirmed with RN in room. Fetal heart tracing, per RN, reported to be stable enough for sitting procedure. Discussed epidural, and patient consents to the procedure:  included risk of possible headache,backache, failed block, allergic reaction, and nerve injury. This patient was asked if she had any questions or concerns before the procedure started. )        Anesthesia Quick Evaluation

## 2011-11-04 NOTE — Progress Notes (Signed)
Patient ID: Christine Greene, female   DOB: [birth year], 26 y.o.   MRN: [id number]  S: Pt transferred to L&D overnight for strengthening of contractions. Cervix changed from 3 on admission to 5 overnight. Pt received an epidural for pain control.  Amnisure was sent for c/o LOF and was negative. O: AFVSS Cvx 5/80/-1 toco irregular q5-7 FHT 130-140 reactive with accels no decels A/P 1) Epidural for pain control.  Anticipate that patient will progress in labor, however pt understands that if labor stalls she will not be augmented. Pt is s/p BMZ x 2 with a single rescue dose x1 on 5/3.  S/p Magnesium sulfate. 2) FWB reassuring 3) If progresses in labor plan to deliver in OR, hopefully vaginally

## 2011-11-05 ENCOUNTER — Encounter (HOSPITAL_COMMUNITY)
Admission: AD | Disposition: A | Discharge status: Home / Self Care | Payer: Self-pay | Source: Ambulatory Visit | Attending: Obstetrics and Gynecology

## 2011-11-05 SURGERY — Surgical Case
Anesthesia: Epidural | Wound class: Clean Contaminated

## 2011-11-05 MED ORDER — IBUPROFEN 600 MG PO TABS
600.0000 mg | ORAL_TABLET | Freq: Four times a day (QID) | ORAL | Status: DC
  Administered 2011-11-05 – 2011-11-07 (×7): 600 mg via ORAL
  Filled 2011-11-05 (×7): qty 1

## 2011-11-05 MED ORDER — OXYCODONE-ACETAMINOPHEN 5-325 MG PO TABS
1.0000 | ORAL_TABLET | ORAL | Status: DC | PRN
Start: 2011-11-05 — End: 2011-11-07
  Administered 2011-11-05 – 2011-11-07 (×7): 1 via ORAL
  Filled 2011-11-05 (×8): qty 1

## 2011-11-05 MED ORDER — TERBUTALINE SULFATE 1 MG/ML IJ SOLN: 0.2500 mg | Freq: Once | INTRAMUSCULAR | Status: DC | PRN

## 2011-11-05 MED ORDER — LIDOCAINE HCL (PF) 1 % IJ SOLN
INTRAMUSCULAR | Status: DC | PRN
  Administered 2011-11-05: 30 mL
  Administered 2011-11-05 (×3): 4 mL

## 2011-11-05 MED ORDER — ONDANSETRON HCL 4 MG/2ML IJ SOLN: 4.0000 mg | INTRAMUSCULAR | Status: DC | PRN

## 2011-11-05 MED ORDER — HYDROCORTISONE 1 % EX CREA
TOPICAL_CREAM | Freq: Four times a day (QID) | CUTANEOUS | Status: DC
  Administered 2011-11-05: 1 via TOPICAL
  Filled 2011-11-05: qty 28

## 2011-11-05 MED ORDER — ONDANSETRON HCL 4 MG PO TABS
4.0000 mg | ORAL_TABLET | ORAL | Status: DC | PRN
  Administered 2011-11-06: 4 mg via ORAL
  Filled 2011-11-05: qty 1

## 2011-11-05 MED ORDER — LIDOCAINE HCL (PF) 1 % IJ SOLN
INTRAMUSCULAR | Status: AC
  Filled 2011-11-05: qty 30

## 2011-11-05 MED ORDER — OXYTOCIN 20 UNITS IN LACTATED RINGERS INFUSION - SIMPLE
1.0000 m[IU]/min | INTRAVENOUS | Status: DC
  Administered 2011-11-05: 2 m[IU]/min via INTRAVENOUS

## 2011-11-05 NOTE — Progress Notes (Signed)
MD discussing plan of care with the pt, will cut off epidural and assess what happens

## 2011-11-05 NOTE — Progress Notes (Signed)
Dr. Dareen Piano on the phone and notified of pt status SVE, membranes intact, and IUPC. Requested MD come and evaluate IUPC. Will continue to monitor.

## 2011-11-05 NOTE — Progress Notes (Signed)
MD reviewing FHR strip, will recheck at noon and possibly transfer to antenatal

## 2011-11-05 NOTE — Progress Notes (Signed)
Orders from MD to perform [facility]

## 2011-11-05 NOTE — Progress Notes (Signed)
Pt reported leakage of fluid. Crist Fat was negative, but amniosure was +. Will augment labor.

## 2011-11-05 NOTE — Progress Notes (Signed)
Dr. Rodman Pickle in and discussed with the pt the plan to restart the epidural infusion.  Pt agrees.

## 2011-11-05 NOTE — Progress Notes (Signed)
MD called for update.  RN to recheck cervix at 4pm

## 2011-11-05 NOTE — Progress Notes (Signed)
MD informed that Amnisure was positive.  Per MD may restart epidural infusion.

## 2011-11-05 NOTE — Progress Notes (Signed)
Pt made no change in her cervix all day yesterday.  Explained to patient and her husband that babies will have a better outcome the longer that they stay in utero.  Plan/ 1) Will check cervix this am. If no change will stop epidural and transfer patient back to antenatal.

## 2011-11-06 ENCOUNTER — Encounter (HOSPITAL_COMMUNITY): Payer: Self-pay | Admitting: Family Medicine

## 2011-11-06 LAB — CBC
MCH: 30.1 pg (ref 26.0–34.0)
MCHC: 33.3 g/dL (ref 30.0–36.0)
Platelets: 95 10*3/uL — ABNORMAL LOW (ref 150–400)
RBC: 2.82 MIL/uL — ABNORMAL LOW (ref 3.87–5.11)

## 2011-11-06 MED ORDER — RHO D IMMUNE GLOBULIN 1500 UNIT/2ML IJ SOLN
300.0000 ug | Freq: Once | INTRAMUSCULAR | Status: AC
  Administered 2011-11-06: 300 ug via INTRAMUSCULAR
  Filled 2011-11-06: qty 2

## 2011-11-06 MED ORDER — BENZOCAINE-MENTHOL 20-0.5 % EX AERO
1.0000 "application " | INHALATION_SPRAY | CUTANEOUS | Status: DC | PRN
  Administered 2011-11-06: 1 via TOPICAL
  Filled 2011-11-06: qty 56

## 2011-11-06 MED ORDER — WITCH HAZEL-GLYCERIN EX PADS: 1.0000 "application " | MEDICATED_PAD | CUTANEOUS | Status: DC | PRN

## 2011-11-06 MED ORDER — ZOLPIDEM TARTRATE 5 MG PO TABS: 5.0000 mg | ORAL_TABLET | Freq: Every evening | ORAL | Status: DC | PRN

## 2011-11-06 MED ORDER — MEASLES, MUMPS & RUBELLA VAC ~~LOC~~ INJ
0.5000 mL | INJECTION | Freq: Once | SUBCUTANEOUS | Status: DC
  Filled 2011-11-06: qty 0.5

## 2011-11-06 MED ORDER — SENNOSIDES-DOCUSATE SODIUM 8.6-50 MG PO TABS
2.0000 | ORAL_TABLET | Freq: Every day | ORAL | Status: DC
  Administered 2011-11-06: 2 via ORAL

## 2011-11-06 MED ORDER — DIBUCAINE 1 % RE OINT: 1.0000 "application " | TOPICAL_OINTMENT | RECTAL | Status: DC | PRN

## 2011-11-06 MED ORDER — PRENATAL MULTIVITAMIN CH
1.0000 | ORAL_TABLET | Freq: Every morning | ORAL | Status: DC
  Administered 2011-11-06 – 2011-11-07 (×2): 1 via ORAL
  Filled 2011-11-06 (×2): qty 1

## 2011-11-06 MED ORDER — TETANUS-DIPHTH-ACELL PERTUSSIS 5-2.5-18.5 LF-MCG/0.5 IM SUSP
0.5000 mL | Freq: Once | INTRAMUSCULAR | Status: DC
  Filled 2011-11-06: qty 0.5

## 2011-11-06 MED ORDER — SIMETHICONE 80 MG PO CHEW
80.0000 mg | CHEWABLE_TABLET | ORAL | Status: DC | PRN
  Administered 2011-11-06 – 2011-11-07 (×3): 80 mg via ORAL

## 2011-11-06 NOTE — Progress Notes (Signed)
UR chart review completed.  

## 2011-11-06 NOTE — Progress Notes (Signed)
NSVD of viable female. 

## 2011-11-06 NOTE — Progress Notes (Signed)
Post Partum Day 1 Subjective: no complaints, up ad lib, voiding, tolerating PO and + flatus  Objective: Blood pressure 115/73, pulse 78, temperature 97.4 F (36.3 C), temperature source Oral, resp. rate 18, height 4\' 11"  (1.499 m), weight 76.204 kg (168 lb), last menstrual period 03/11/2011, SpO2 97.00%, unknown if currently breastfeeding.  Physical Exam:  General: alert, cooperative and appears stated age Lochia: appropriate Uterine Fundus: firm   Basename 11/06/11 0520 11/04/11 0525  HGB 8.5* 10.7*  HCT 25.5* 32.5*    Assessment/Plan: S/P 33 wk svd twins Breastfeeding and pumping Babies in NICU on room arir doing well   LOS: 5 days   Christine Greene H. 11/06/2011, 7:38 PM

## 2011-11-06 NOTE — Progress Notes (Signed)
Patient ID: Christine Greene, female   DOB: [birth year], 26 y.o.   MRN: [id number]  Pt in NICU seeing babies

## 2011-11-06 NOTE — Progress Notes (Signed)
NSVD of viable female 

## 2011-11-06 NOTE — Anesthesia Postprocedure Evaluation (Signed)
Anesthesia Post Note  Patient: Christine Greene  Procedure(s) Performed: * No procedures listed *  Anesthesia type: Epidural  Patient location: Mother/Baby  Post pain: Pain level controlled  Post assessment: Post-op Vital signs reviewed  Last Vitals:  Filed Vitals:   11/06/11 0551  BP: 132/79  Pulse: 74  Temp: 36.6 C  Resp: 18    Post vital signs: Reviewed  Level of consciousness: awake  Complications: No apparent anesthesia complications

## 2011-11-07 LAB — RH IG WORKUP (INCLUDES ABO/RH)
Gestational Age(Wks): 33.4
Unit division: 0

## 2011-11-07 NOTE — Progress Notes (Signed)
Pt is discharged in the care of husband,downstairs per ambulatory . Infant to remain in [place]. Denies any heavy vaginal bleeding.,fundus is firm. Stable. Denies pain or discomfort.

## 2011-11-07 NOTE — Progress Notes (Addendum)
Patient is eating, ambulating, voiding.  Pain control is good.  Filed Vitals:   11/06/11 1200 11/06/11 1902 11/06/11 2156 11/07/11 0604  BP: 104/68 115/73 111/69 95/59  Pulse: 83 78 77 77  Temp: 97.6 F (36.4 C) 97.4 F (36.3 C) 98.2 F (36.8 C) 97.5 F (36.4 C)  TempSrc: Oral Oral Oral Oral  Resp: 20 18 18 18   Height:      Weight:      SpO2: 97% 97% 98% 97%    Fundus firm Perineum without swelling. Ex 2+ swelling  Lab Results  Component Value Date   WBC 8.7 11/06/2011   HGB 8.5* 11/06/2011   HCT 25.5* 11/06/2011   MCV 90.4 11/06/2011   PLT 95* 11/06/2011    --/--/B NEG (05/08 0525)/RI  A/P Post partum day 2.   Babies doing well in NICU  Routine care.  Expect d/c today.  Rhogam- Baby A is Rh +.  Christine Greene A

## 2011-11-07 NOTE — Discharge Summary (Signed)
Obstetric Discharge Summary Reason for Admission: preterm labor with twins Prenatal Procedures: preterm labor, magnesium Intrapartum Procedures: spontaneous vaginal delivery times two Postpartum Procedures: Rho(D) Ig Complications-Operative and Postpartum: none Hemoglobin  Date Value Range Status  11/06/2011 8.5* 12.0-15.0 (g/dL) Final     DELTA CHECK NOTED     REPEATED TO VERIFY     HCT  Date Value Range Status  11/06/2011 25.5* 36.0-46.0 (%) Final   Hospital Course:  Pt admitted on 5-3 for preterm contractions and rescue dose of BMZ.  She was started on MgSO4 for 24 hours and ctxes were slowed.  On 5-7 she her contractions restarted and she delivered twins vaginally.  Postpartum course has been uncomplicated and she received Rhogam.    Discharge Diagnoses: Premature labor  Discharge Information: Date: 11/07/2011 Activity: pelvic rest Diet: routine Medications: Ibuprofen Condition: stable Instructions: refer to practice specific booklet Discharge to: home Follow-up Information    Follow up with Calan Doren A, MD. Schedule an appointment as soon as possible for a visit in 4 weeks.   Contact information:   [address] [place] [address] [phone]          Newborn Data:   Lavaya, Defreitas [[id number]]  Live born female  Birth Weight: 5 lb 1.5 oz (2310 g) APGAR: 8, 9   Akhila, Mahnken [[id number]]  Live born female  Birth Weight: 4 lb 3.4 oz (1910 g) APGAR: 7, 8  Home with mother.  Roshonda Sperl A 11/07/2011, 8:41 AM

## 2013-10-08 ENCOUNTER — Other Ambulatory Visit: Payer: Self-pay | Admitting: Obstetrics and Gynecology

## 2014-05-02 ENCOUNTER — Encounter (HOSPITAL_COMMUNITY): Payer: Self-pay | Admitting: Family Medicine

## 2014-10-14 ENCOUNTER — Other Ambulatory Visit: Payer: Self-pay | Admitting: Obstetrics and Gynecology

## 2014-10-17 LAB — CYTOLOGY - PAP

## 2015-06-28 ENCOUNTER — Ambulatory Visit (HOSPITAL_COMMUNITY): Admission: RE | Admit: 2015-06-28 | Payer: 59 | Source: Ambulatory Visit

## 2015-06-28 ENCOUNTER — Ambulatory Visit (HOSPITAL_COMMUNITY): Payer: 59

## 2015-06-28 ENCOUNTER — Encounter (HOSPITAL_COMMUNITY): Payer: Self-pay

## 2015-06-28 ENCOUNTER — Inpatient Hospital Stay (HOSPITAL_COMMUNITY)
Admission: AD | Admit: 2015-06-28 | Discharge: 2015-06-28 | Disposition: A | Discharge status: Home / Self Care | Payer: 59 | Source: Ambulatory Visit | Attending: Obstetrics | Admitting: Obstetrics

## 2015-06-28 DIAGNOSIS — N644 Mastodynia: Secondary | ICD-10-CM | POA: Insufficient documentation

## 2015-06-28 DIAGNOSIS — R102 Pelvic and perineal pain: Secondary | ICD-10-CM | POA: Diagnosis not present

## 2015-06-28 DIAGNOSIS — R109 Unspecified abdominal pain: Secondary | ICD-10-CM | POA: Diagnosis present

## 2015-06-28 LAB — COMPREHENSIVE METABOLIC PANEL
ALBUMIN: 4.3 g/dL (ref 3.5–5.0)
ALT: 24 U/L (ref 14–54)
ANION GAP: 10 (ref 5–15)
AST: 25 U/L (ref 15–41)
Alkaline Phosphatase: 66 U/L (ref 38–126)
BILIRUBIN TOTAL: 0.6 mg/dL (ref 0.3–1.2)
BUN: 11 mg/dL (ref 6–20)
CO2: 27 mmol/L (ref 22–32)
Calcium: 9.3 mg/dL (ref 8.9–10.3)
Chloride: 103 mmol/L (ref 101–111)
Creatinine, Ser: 0.57 mg/dL (ref 0.44–1.00)
GFR calc Af Amer: >60 mL/min (ref >60)
GFR calc non Af Amer: >60 mL/min (ref >60)
GLUCOSE: 99 mg/dL (ref 65–99)
POTASSIUM: 3.6 mmol/L (ref 3.5–5.1)
Sodium: 140 mmol/L (ref 135–145)
TOTAL PROTEIN: 7 g/dL (ref 6.5–8.1)

## 2015-06-28 LAB — CBC WITH DIFFERENTIAL/PLATELET
BASOS PCT: 0 %
Basophils Absolute: 0 10*3/uL (ref 0.0–0.1)
EOS ABS: 0.1 10*3/uL (ref 0.0–0.7)
Eosinophils Relative: 1 %
HEMATOCRIT: 39.3 % (ref 36.0–46.0)
Hemoglobin: 13.8 g/dL (ref 12.0–15.0)
Lymphocytes Relative: 28 %
Lymphs Abs: 1.9 10*3/uL (ref 0.7–4.0)
MCH: 30.3 pg (ref 26.0–34.0)
MCHC: 35.1 g/dL (ref 30.0–36.0)
MCV: 86.2 fL (ref 78.0–100.0)
MONO ABS: 0.6 10*3/uL (ref 0.1–1.0)
MONOS PCT: 9 %
NEUTROS ABS: 4.1 10*3/uL (ref 1.7–7.7)
Neutrophils Relative %: 62 %
Platelets: 212 10*3/uL (ref 150–400)
RBC: 4.56 MIL/uL (ref 3.87–5.11)
RDW: 12.9 % (ref 11.5–15.5)
WBC: 6.6 10*3/uL (ref 4.0–10.5)

## 2015-06-28 LAB — URINALYSIS, ROUTINE W REFLEX MICROSCOPIC
Bilirubin Urine: NEGATIVE
Glucose, UA: NEGATIVE mg/dL
Hgb urine dipstick: NEGATIVE
Ketones, ur: NEGATIVE mg/dL
LEUKOCYTES UA: NEGATIVE
NITRITE: NEGATIVE
PROTEIN: NEGATIVE mg/dL
Specific Gravity, Urine: 1.02 (ref 1.005–1.030)
pH: 6.5 (ref 5.0–8.0)

## 2015-06-28 LAB — WET PREP, GENITAL
CLUE CELLS WET PREP: NONE SEEN
Sperm: NONE SEEN
Trich, Wet Prep: NONE SEEN
YEAST WET PREP: NONE SEEN

## 2015-06-28 LAB — HCG, QUANTITATIVE, PREGNANCY: HCG, BETA CHAIN, QUANT, S: 1 m[IU]/mL (ref <5)

## 2015-06-28 NOTE — MAU Note (Signed)
Pt presents to MAU from physician's office for abdominal pain. PT was [facility] and had two negative pregnancy test there but has had two positive at home. History of ectopic

## 2015-06-28 NOTE — MAU Provider Note (Signed)
MAU HISTORY AND PHYSICAL  Chief Complaint:  Abdominal Pain   Christine Greene is a 29 y.o.  [phone] with IUP at Unknown presenting for Abdominal Pain  Began 2 days ago. Lower abdomen/pelvis. Cramping like pain. Feeling bloated. No dysuria but does report increased frequency, no hesitancy. Low back pain. No fevers or chills. No vaginal discharge, pain, itching, or burning. No vaginal bleeding. Sexually active with husband, no other partners, no history STD. Irregular periods, last period November 9th. Says doesn't suffer from PMS. Does report breast tenderness. Not constipated. Diffuse pain, does not favor one side over the other.   2 recent positive UPTs at home, went to [facility] today and UPT negative. Didn't have exam, was sent here for "exam, ultrasound, bloodwork."    History reviewed. No pertinent past medical history.  Past Surgical History  Procedure Laterality Date  . Dilation and curettage of uterus    . Laparoscopy for ectopic pregnancy    . Vaginal delivery  11/05/2011    Procedure: VAGINAL DELIVERY;  Surgeon: Levi Aland, MD;  Location: [facility];  Service: Gynecology;  Laterality: N/A;    Family History  Problem Relation Age of Onset  . Diabetes Father     Social History  Substance Use Topics  . Smoking status: Never Smoker   . Smokeless tobacco: Never Used  . Alcohol Use: No    No Known Allergies  Prescriptions prior to admission  Medication Sig Dispense Refill Last Dose  . citalopram (CELEXA) 40 MG tablet Take 40 mg by mouth daily.   06/27/2015 at Unknown time  . Prenatal Vit-Fe Fumarate-FA (PRENATAL MULTIVITAMIN) TABS Take 1 tablet by mouth every morning.   06/27/2015 at Unknown time    Review of Systems - Negative except for what is mentioned in HPI.  Physical Exam  Blood pressure 138/82, pulse 87, temperature 97.9 F (36.6 C), resp. rate 18, last menstrual period 05/10/2015, unknown if currently breastfeeding. GENERAL: Well-developed,  well-nourished female in no acute distress.  LUNGS: Clear to auscultation bilaterally.  HEART: Regular rate and rhythm. ABDOMEN: Soft, mild ttp right pelvis, no rebound or guarding, nondistended.  EXTREMITIES: Nontender, no edema, 2+ distal pulses. GU: normal vagina and cervix, no cmt, right adnexal tenderness w/o fullness   Labs: Results for orders placed or performed during the hospital encounter of 06/28/15 (from the past 24 hour(s))  Urinalysis, Routine w reflex microscopic (not at [facility])   Collection Time: 06/28/15  3:35 PM  Result Value Ref Range   Color, Urine YELLOW YELLOW   APPearance CLEAR CLEAR   Specific Gravity, Urine 1.020 1.005 - 1.030   pH 6.5 5.0 - 8.0   Glucose, UA NEGATIVE NEGATIVE mg/dL   Hgb urine dipstick NEGATIVE NEGATIVE   Bilirubin Urine NEGATIVE NEGATIVE   Ketones, ur NEGATIVE NEGATIVE mg/dL   Protein, ur NEGATIVE NEGATIVE mg/dL   Nitrite NEGATIVE NEGATIVE   Leukocytes, UA NEGATIVE NEGATIVE  Wet prep, genital   Collection Time: 06/28/15  4:12 PM  Result Value Ref Range   Yeast Wet Prep HPF POC NONE SEEN NONE SEEN   Trich, Wet Prep NONE SEEN NONE SEEN   Clue Cells Wet Prep HPF POC NONE SEEN NONE SEEN   WBC, Wet Prep HPF POC MODERATE (A) NONE SEEN   Sperm NONE SEEN   CBC with Differential/Platelet   Collection Time: 06/28/15  4:17 PM  Result Value Ref Range   WBC 6.6 4.0 - 10.5 K/uL   RBC 4.56 3.87 - 5.11 MIL/uL  Hemoglobin 13.8 12.0 - 15.0 g/dL   HCT 40.939.3 81.136.0 - 91.446.0 %   MCV 86.2 78.0 - 100.0 fL   MCH 30.3 26.0 - 34.0 pg   MCHC 35.1 30.0 - 36.0 g/dL   RDW 78.212.9 95.611.5 - 21.315.5 %   Platelets 212 150 - 400 K/uL   Neutrophils Relative % 62 %   Neutro Abs 4.1 1.7 - 7.7 K/uL   Lymphocytes Relative 28 %   Lymphs Abs 1.9 0.7 - 4.0 K/uL   Monocytes Relative 9 %   Monocytes Absolute 0.6 0.1 - 1.0 K/uL   Eosinophils Relative 1 %   Eosinophils Absolute 0.1 0.0 - 0.7 K/uL   Basophils Relative 0 %   Basophils Absolute 0.0 0.0 - 0.1 K/uL  Comprehensive  metabolic panel   Collection Time: 06/28/15  4:17 PM  Result Value Ref Range   Sodium 140 135 - 145 mmol/L   Potassium 3.6 3.5 - 5.1 mmol/L   Chloride 103 101 - 111 mmol/L   CO2 27 22 - 32 mmol/L   Glucose, Bld 99 65 - 99 mg/dL   BUN 11 6 - 20 mg/dL   Creatinine, Ser 0.860.57 0.44 - 1.00 mg/dL   Calcium 9.3 8.9 - 57.810.3 mg/dL   Total Protein 7.0 6.5 - 8.1 g/dL   Albumin 4.3 3.5 - 5.0 g/dL   AST 25 15 - 41 U/L   ALT 24 14 - 54 U/L   Alkaline Phosphatase 66 38 - 126 U/L   Total Bilirubin 0.6 0.3 - 1.2 mg/dL   GFR calc non Af Amer >60 >60 mL/min   GFR calc Af Amer >60 >60 mL/min   Anion gap 10 5 - 15  hCG, quantitative, pregnancy   Collection Time: 06/28/15  4:18 PM  Result Value Ref Range   hCG, Beta Chain, Quant, S 1 <5 mIU/mL    Imaging Studies:  No results found.  Assessment: Olean ReeChristina H Savas is  29 y.o. [id number] at Unknown presents with right pelvic pain. On exam well-appearing, normal vitals. On bimanual right adnexal tenderness. Laboratory analysis unremarkable: urinalysis not suggestive of infection, upt and serum hcg negative for pregnancy (serum obtained as patient wanted confirmation saying her home upt was positive), wet prep unremarkable, cmp normal, cbc w/o signs infection). Ordered pelvic u/s to evaluate right adnexa but delay in obtaining 2/2 busy emergency room. Patient elects to not wait for this exam and instead obtain as outpatient. The above discussed w/ Dr. Chestine Sporelark.  Plan: - pelvic pain return precautions - f/u gonorrhea/chlamydia - outpatient pelvic u/s - clinic f/u 2 days  Silvano Bilisoah B Selvin Yun 12/28/20167:17 PM

## 2015-07-01 LAB — GC/CHLAMYDIA PROBE AMP (~~LOC~~) NOT AT ARMC
CHLAMYDIA, DNA PROBE: NEGATIVE
Neisseria Gonorrhea: NEGATIVE

## 2015-12-13 ENCOUNTER — Other Ambulatory Visit: Payer: Self-pay | Admitting: Obstetrics and Gynecology

## 2015-12-15 LAB — CYTOLOGY - PAP

## 2016-02-13 ENCOUNTER — Other Ambulatory Visit: Payer: Self-pay | Admitting: Obstetrics and Gynecology

## 2016-03-05 LAB — OB RESULTS CONSOLE ABO/RH: "RH Type ": NEGATIVE

## 2016-03-05 LAB — OB RESULTS CONSOLE ANTIBODY SCREEN: Antibody Screen: NEGATIVE

## 2016-03-05 LAB — OB RESULTS CONSOLE GC/CHLAMYDIA
Chlamydia: NEGATIVE
Gonorrhea: NEGATIVE

## 2016-03-05 LAB — OB RESULTS CONSOLE HIV ANTIBODY (ROUTINE TESTING): HIV: NONREACTIVE

## 2016-03-05 LAB — OB RESULTS CONSOLE RPR: RPR: NONREACTIVE

## 2016-03-05 LAB — OB RESULTS CONSOLE HEPATITIS B SURFACE ANTIGEN: Hepatitis B Surface Ag: NEGATIVE

## 2016-03-05 LAB — OB RESULTS CONSOLE RUBELLA ANTIBODY, IGM: Rubella: IMMUNE

## 2016-03-12 ENCOUNTER — Inpatient Hospital Stay (HOSPITAL_COMMUNITY)
Admission: AD | Admit: 2016-03-12 | Discharge: 2016-03-12 | Disposition: A | Discharge status: Home / Self Care | Payer: Medicaid Other | Source: Ambulatory Visit | Attending: Obstetrics and Gynecology | Admitting: Obstetrics and Gynecology

## 2016-03-12 ENCOUNTER — Encounter (HOSPITAL_COMMUNITY): Payer: Self-pay

## 2016-03-12 DIAGNOSIS — K219 Gastro-esophageal reflux disease without esophagitis: Secondary | ICD-10-CM | POA: Diagnosis not present

## 2016-03-12 DIAGNOSIS — O99611 Diseases of the digestive system complicating pregnancy, first trimester: Secondary | ICD-10-CM | POA: Insufficient documentation

## 2016-03-12 DIAGNOSIS — Z3A11 11 weeks gestation of pregnancy: Secondary | ICD-10-CM | POA: Insufficient documentation

## 2016-03-12 DIAGNOSIS — K5900 Constipation, unspecified: Secondary | ICD-10-CM | POA: Insufficient documentation

## 2016-03-12 DIAGNOSIS — Z7984 Long term (current) use of oral hypoglycemic drugs: Secondary | ICD-10-CM | POA: Insufficient documentation

## 2016-03-12 DIAGNOSIS — R1032 Left lower quadrant pain: Secondary | ICD-10-CM | POA: Diagnosis present

## 2016-03-12 LAB — URINALYSIS, ROUTINE W REFLEX MICROSCOPIC
Bilirubin Urine: NEGATIVE
GLUCOSE, UA: NEGATIVE mg/dL
HGB URINE DIPSTICK: NEGATIVE
Ketones, ur: NEGATIVE mg/dL
LEUKOCYTES UA: NEGATIVE
Nitrite: NEGATIVE
PH: 6 (ref 5.0–8.0)
Protein, ur: NEGATIVE mg/dL
SPECIFIC GRAVITY, URINE: 1.02 (ref 1.005–1.030)

## 2016-03-12 LAB — POCT PREGNANCY, URINE: Preg Test, Ur: POSITIVE — AB

## 2016-03-12 MED ORDER — DOCUSATE SODIUM 100 MG PO CAPS: 100.0000 mg | ORAL_CAPSULE | Freq: Two times a day (BID) | ORAL | 0 refills | Status: DC

## 2016-03-12 MED ORDER — GI COCKTAIL ~~LOC~~
30.0000 mL | Freq: Once | ORAL | Status: AC
  Administered 2016-03-12: 30 mL via ORAL
  Filled 2016-03-12: qty 30

## 2016-03-12 NOTE — MAU Note (Signed)
Pt c/o LLQ pain that is sharp for several days. States that it feels like gas pain. States that her last "good BM" was 3 days. Took miralax, gas x and nexium around 530p. States that she also has vomiting-states she vomited only once today-seems to be "acidy". Denies vag bleeding or discharge. Denies urinary s/s.

## 2016-03-12 NOTE — Discharge Instructions (Signed)

## 2016-03-12 NOTE — MAU Provider Note (Signed)
History     CSN: [id number]  Arrival date and time: 03/12/16 2104   First Provider Initiated Contact with Patient 03/12/16 2136      Chief Complaint  Patient presents with  . Abdominal Pain   Christine Greene is a 30 y.o. [id number] at 6119w6d who presents today with LLQ pain. She states that the pain started yesterday. She has not been able to have regular BMs. She states that she took miralax for the first time today. She did have one small BM that was hard and formed afterwards. She denies any vaginal bleeding.    Abdominal Pain  This is a new problem. The current episode started yesterday. The onset quality is gradual. The problem occurs intermittently. The problem has been unchanged. The pain is located in the LLQ. The pain is at a severity of 5/10. The quality of the pain is sharp. Associated symptoms include constipation (last BM today, but small and formed. ), nausea and vomiting. Pertinent negatives include no diarrhea, dysuria, fever or frequency. Nothing aggravates the pain. The pain is relieved by nothing. Treatments tried: Miralax, gas-x.  The treatment provided mild relief.    History reviewed. No pertinent past medical history.  Past Surgical History:  Procedure Laterality Date  . DILATION AND CURETTAGE OF UTERUS    . LAPAROSCOPY FOR ECTOPIC PREGNANCY    . VAGINAL DELIVERY  11/05/2011   Procedure: VAGINAL DELIVERY;  Surgeon: Levi AlandMark E Anderson, MD;  Location: [facility];  Service: Gynecology;  Laterality: N/A;    Family History  Problem Relation Age of Onset  . Diabetes Father     Social History  Substance Use Topics  . Smoking status: Never Smoker  . Smokeless tobacco: Never Used  . Alcohol use No    Allergies: No Known Allergies  Prescriptions Prior to Admission  Medication Sig Dispense Refill Last Dose  . escitalopram (LEXAPRO) 10 MG tablet Take 10 mg by mouth daily.   03/11/2016 at Unknown time  . esomeprazole (NEXIUM) 40 MG capsule Take 40 mg by mouth daily at 12  noon.   03/12/2016 at Unknown time  . metFORMIN (GLUCOPHAGE) 1000 MG tablet Take 1,000 mg by mouth 2 (two) times daily with a meal.     . ondansetron (ZOFRAN-ODT) 4 MG disintegrating tablet Take 4 mg by mouth every 8 (eight) hours as needed for nausea or vomiting.   03/12/2016 at Unknown time  . polyethylene glycol (MIRALAX / GLYCOLAX) packet Take 17 g by mouth daily.   03/12/2016 at Unknown time  . Prenatal Vit-Fe Fumarate-FA (PRENATAL MULTIVITAMIN) TABS Take 1 tablet by mouth every morning.   03/11/2016 at Unknown time  . progesterone (PROMETRIUM) 200 MG capsule Take 200 mg by mouth daily.   03/11/2016 at Unknown time  . simethicone (MYLICON) 80 MG chewable tablet Chew 80 mg by mouth every 6 (six) hours as needed for flatulence.   03/12/2016 at Unknown time  . citalopram (CELEXA) 40 MG tablet Take 40 mg by mouth daily.   06/27/2015 at Unknown time    Review of Systems  Constitutional: Negative for chills and fever.  Gastrointestinal: Positive for abdominal pain, constipation (last BM today, but small and formed. ), heartburn, nausea and vomiting. Negative for diarrhea.  Genitourinary: Negative for dysuria, frequency and urgency.   Physical Exam   Blood pressure 118/70, pulse 72, temperature 98.5 F (36.9 C), temperature source Oral, resp. rate 16, height 4\' 11"  (1.499 m), weight 168 lb (76.2 kg), last menstrual period 12/11/2014, SpO2 97 %,  unknown if currently breastfeeding.  Physical Exam  Nursing note and vitals reviewed. Constitutional: She is oriented to person, place, and time. She appears well-developed and well-nourished. No distress.  HENT:  Head: Normocephalic.  Cardiovascular: Normal rate.   Respiratory: Effort normal.  GI: Soft. There is tenderness (mild tenderness on the left side of the abdomen. ). There is no rebound.  Neurological: She is alert and oriented to person, place, and time.  Skin: Skin is warm and dry.  Psychiatric: She has a normal mood and affect.   Results  for orders placed or performed during the hospital encounter of 03/12/16 (from the past 24 hour(s))  Urinalysis, Routine w reflex microscopic (not at [facility])     Status: Abnormal   Collection Time: 03/12/16  9:09 PM  Result Value Ref Range   Color, Urine YELLOW YELLOW   APPearance HAZY (A) CLEAR   Specific Gravity, Urine 1.020 1.005 - 1.030   pH 6.0 5.0 - 8.0   Glucose, UA NEGATIVE NEGATIVE mg/dL   Hgb urine dipstick NEGATIVE NEGATIVE   Bilirubin Urine NEGATIVE NEGATIVE   Ketones, ur NEGATIVE NEGATIVE mg/dL   Protein, ur NEGATIVE NEGATIVE mg/dL   Nitrite NEGATIVE NEGATIVE   Leukocytes, UA NEGATIVE NEGATIVE    MAU Course  Procedures  MDM 2115: D/W Dr. Henderson Cloud.  Patient has had enema and GI cocktail. She reports that she is feeling better at this time.   Assessment and Plan   1. Constipation, unspecified constipation type   2. Gastroesophageal reflux disease, esophagitis presence not specified   3. [redacted] weeks gestation of pregnancy    DC home Comfort measures reviewed  1st Trimester precautions  RX: colace BID Return to MAU as needed FU with OB as planned  Follow-up Information    HORVATH,MICHELLE A, MD .   Specialty:  Obstetrics and Gynecology Contact information: [address] [place] [address] [phone]            Tawnya Crook 03/12/2016, 9:47 PM

## 2016-04-15 ENCOUNTER — Other Ambulatory Visit: Payer: Self-pay | Admitting: Obstetrics & Gynecology

## 2016-06-26 ENCOUNTER — Encounter (HOSPITAL_COMMUNITY): Payer: Self-pay | Admitting: *Deleted

## 2016-06-26 ENCOUNTER — Inpatient Hospital Stay (HOSPITAL_COMMUNITY): Payer: Medicaid Other

## 2016-06-26 ENCOUNTER — Inpatient Hospital Stay (HOSPITAL_COMMUNITY)
Admission: AD | Admit: 2016-06-26 | Discharge: 2016-06-26 | Disposition: A | Discharge status: Home / Self Care | Payer: Medicaid Other | Source: Ambulatory Visit | Attending: Obstetrics and Gynecology | Admitting: Obstetrics and Gynecology

## 2016-06-26 DIAGNOSIS — R109 Unspecified abdominal pain: Secondary | ICD-10-CM

## 2016-06-26 DIAGNOSIS — O4402 Placenta previa specified as without hemorrhage, second trimester: Secondary | ICD-10-CM | POA: Diagnosis not present

## 2016-06-26 DIAGNOSIS — O09892 Supervision of other high risk pregnancies, second trimester: Secondary | ICD-10-CM | POA: Insufficient documentation

## 2016-06-26 DIAGNOSIS — O26892 Other specified pregnancy related conditions, second trimester: Secondary | ICD-10-CM | POA: Diagnosis present

## 2016-06-26 DIAGNOSIS — Z3A27 27 weeks gestation of pregnancy: Secondary | ICD-10-CM | POA: Diagnosis not present

## 2016-06-26 DIAGNOSIS — O26899 Other specified pregnancy related conditions, unspecified trimester: Secondary | ICD-10-CM

## 2016-06-26 DIAGNOSIS — R102 Pelvic and perineal pain: Secondary | ICD-10-CM | POA: Diagnosis present

## 2016-06-26 DIAGNOSIS — Z8759 Personal history of other complications of pregnancy, childbirth and the puerperium: Secondary | ICD-10-CM

## 2016-06-26 LAB — URINALYSIS, ROUTINE W REFLEX MICROSCOPIC
BILIRUBIN URINE: NEGATIVE
GLUCOSE, UA: 50 mg/dL — AB
Hgb urine dipstick: NEGATIVE
KETONES UR: 5 mg/dL — AB
Nitrite: NEGATIVE
PH: 5 (ref 5.0–8.0)
Protein, ur: 30 mg/dL — AB
SPECIFIC GRAVITY, URINE: 1.031 — AB (ref 1.005–1.030)

## 2016-06-26 LAB — FETAL FIBRONECTIN: Fetal Fibronectin: NEGATIVE

## 2016-06-26 NOTE — MAU Note (Signed)
Pt reports contractions and pressure today. Denies bleeding.

## 2016-06-26 NOTE — Discharge Instructions (Signed)
. °Preterm Labor and Birth Information °The normal length of a pregnancy is 39-41 weeks. Preterm labor is when labor starts before 37 completed weeks of pregnancy. °What are the risk factors for preterm labor? °Preterm labor is more likely to occur in women who: °· Have certain infections during pregnancy such as a bladder infection, sexually transmitted infection, or infection inside the uterus (chorioamnionitis). °· Have a shorter-than-normal cervix. °· Have gone into preterm labor before. °· Have had surgery on their cervix. °· Are younger than age 32 or older than age 32. °· Are African American. °· Are pregnant with twins or multiple babies (multiple gestation). °· Take street drugs or smoke while pregnant. °· Do not gain enough weight while pregnant. °· Became pregnant shortly after having been pregnant. °What are the symptoms of preterm labor? °Symptoms of preterm labor include: °· Cramps similar to those that can happen during a menstrual period. The cramps may happen with diarrhea. °· Pain in the abdomen or lower back. °· Regular uterine contractions that may feel like tightening of the abdomen. °· A feeling of increased pressure in the pelvis. °· Increased watery or bloody mucus discharge from the vagina. °· Water breaking (ruptured amniotic sac). °Why is it important to recognize signs of preterm labor? °It is important to recognize signs of preterm labor because babies who are born prematurely may not be fully developed. This can put them at an increased risk for: °· Long-term (chronic) heart and lung problems. °· Difficulty immediately after birth with regulating body systems, including blood sugar, body temperature, heart rate, and breathing rate. °· Bleeding in the brain. °· Cerebral palsy. °· Learning difficulties. °· Death. °These risks are highest for babies who are born before 34 weeks of pregnancy. °How is preterm labor treated? °Treatment depends on the length of your pregnancy, your condition,  and the health of your baby. It may involve: °· Having a stitch (suture) placed in your cervix to prevent your cervix from opening too early (cerclage). °· Taking or being given medicines, such as: °¨ Hormone medicines. These may be given early in pregnancy to help support the pregnancy. °¨ Medicine to stop contractions. °¨ Medicines to help mature the baby’s lungs. These may be prescribed if the risk of delivery is high. °¨ Medicines to prevent your baby from developing cerebral palsy. °If the labor happens before 34 weeks of pregnancy, you may need to stay in the hospital. °What should I do if I think I am in preterm labor? °If you think that you are going into preterm labor, call your health care provider right away. °How can I prevent preterm labor in future pregnancies? °To increase your chance of having a full-term pregnancy: °· Do not use any tobacco products, such as cigarettes, chewing tobacco, and e-cigarettes. If you need help quitting, ask your health care provider. °· Do not use street drugs or medicines that have not been prescribed to you during your pregnancy. °· Talk with your health care provider before taking any herbal supplements, even if you have been taking them regularly. °· Make sure you gain a healthy amount of weight during your pregnancy. °· Watch for infection. If you think that you might have an infection, get it checked right away. °· Make sure to tell your health care provider if you have gone into preterm labor before. °This information is not intended to replace advice given to you by your health care provider. Make sure you discuss any questions you have with your   health care provider. °Document Released: 09/07/2003 Document Revised: 11/28/2015 Document Reviewed: 11/08/2015 °Elsevier Interactive Patient Education © 2017 [employer] ° °

## 2016-06-26 NOTE — MAU Provider Note (Signed)
History     CSN: [id number]  Arrival date and time: 06/26/16 2032   First Provider Initiated Contact with Patient 06/26/16 2119      Chief Complaint  Patient presents with  . Contractions   Christine Greene is a 30 y.o. [id number] at 459w0d who presents today with contractions. She states that she has had been having contractions all day, and on occasion she will feel sharp pain shooting down her leg. Hx of 33 week preterm delivery of twins. She reports that she has a placenta previa.    Pelvic Pain  The patient's primary symptoms include pelvic pain. This is a new problem. The current episode started today. The problem occurs intermittently. The problem has been unchanged. Pain severity now: 3/10  The problem affects both (some pain shooting down left leg. ) sides. Associated symptoms include abdominal pain. Pertinent negatives include no chills, constipation, diarrhea, dysuria, fever, frequency, nausea, urgency or vomiting. The vaginal discharge was normal. There has been no bleeding. Nothing aggravates the symptoms. She has tried nothing for the symptoms. Sexual activity: No intercourse in the last 24 hours.  Patient states that she has placenta previa. She had an US at 23 weeks, and has another on 07/03/16    History reviewed. No pertinent past medical history.  Past Surgical History:  Procedure Laterality Date  . DILATION AND CURETTAGE OF UTERUS    . LAPAROSCOPY FOR ECTOPIC PREGNANCY    . VAGINAL DELIVERY  11/05/2011   Procedure: VAGINAL DELIVERY;  Surgeon: Levi AlandMark E Anderson, MD;  Location: [facility];  Service: Gynecology;  Laterality: N/A;    Family History  Problem Relation Age of Onset  . Diabetes Father     Social History  Substance Use Topics  . Smoking status: Never Smoker  . Smokeless tobacco: Never Used  . Alcohol use No    Allergies: No Known Allergies  Prescriptions Prior to Admission  Medication Sig Dispense Refill Last Dose  . docusate sodium (COLACE) 100 MG  capsule Take 1 capsule (100 mg total) by mouth 2 (two) times daily. 60 capsule 0 06/25/2016 at Unknown time  . escitalopram (LEXAPRO) 10 MG tablet Take 10 mg by mouth daily.   06/25/2016 at Unknown time  . esomeprazole (NEXIUM) 40 MG capsule Take 40 mg by mouth daily at 12 noon.   06/26/2016 at Unknown time  . ondansetron (ZOFRAN-ODT) 4 MG disintegrating tablet Take 4 mg by mouth every 8 (eight) hours as needed for nausea or vomiting.   06/26/2016 at Unknown time  . Prenatal Vit-Fe Fumarate-FA (PRENATAL MULTIVITAMIN) TABS Take 1 tablet by mouth every morning.   06/25/2016 at Unknown time    Review of Systems  Constitutional: Negative for chills and fever.  Gastrointestinal: Positive for abdominal pain. Negative for constipation, diarrhea, nausea and vomiting.  Genitourinary: Positive for pelvic pain. Negative for dysuria, frequency and urgency.   Physical Exam   Blood pressure 126/68, pulse 93, temperature 98.1 F (36.7 C), temperature source Oral, resp. rate 18, height 4\' 11"  (1.499 m), weight 175 lb (79.4 kg), last menstrual period 12/11/2014, SpO2 99 %, unknown if currently breastfeeding.  Physical Exam  Nursing note and vitals reviewed. Constitutional: She is oriented to person, place, and time. She appears well-developed and well-nourished. No distress.  HENT:  Head: Normocephalic.  Cardiovascular: Normal rate.   Respiratory: Effort normal.  GI: Soft. There is no tenderness. There is no rebound.  Neurological: She is alert and oriented to person, place, and time.  Skin: Skin  is warm and dry.  Psychiatric: She has a normal mood and affect.   Results for orders placed or performed during the hospital encounter of 06/26/16 (from the past 24 hour(s))  Urinalysis, Routine w reflex microscopic     Status: Abnormal   Collection Time: 06/26/16  8:40 PM  Result Value Ref Range   Color, Urine YELLOW YELLOW   APPearance HAZY (A) CLEAR   Specific Gravity, Urine 1.031 (H) 1.005 - 1.030    pH 5.0 5.0 - 8.0   Glucose, UA 50 (A) NEGATIVE mg/dL   Hgb urine dipstick NEGATIVE NEGATIVE   Bilirubin Urine NEGATIVE NEGATIVE   Ketones, ur 5 (A) NEGATIVE mg/dL   Protein, ur 30 (A) NEGATIVE mg/dL   Nitrite NEGATIVE NEGATIVE   Leukocytes, UA TRACE (A) NEGATIVE   RBC / HPF 0-5 0 - 5 RBC/hpf   WBC, UA 0-5 0 - 5 WBC/hpf   Bacteria, UA RARE (A) NONE SEEN   Squamous Epithelial / LPF 0-5 (A) NONE SEEN   Mucous PRESENT   Fetal fibronectin     Status: None   Collection Time: 06/26/16  9:25 PM  Result Value Ref Range   Fetal Fibronectin NEGATIVE NEGATIVE   [place]: Breech, cervical length 4.2 cm, previa resolved  FHT: 135, moderate with 10x10 accels, no decels Toco: no UCs  MAU Course  Procedures  MDM 2220: Left message with Dr. Dareen Piano to call the unit.  2224: D/W Dr. Dareen Piano, ok for DC home.   Assessment and Plan   1. [redacted] weeks gestation of pregnancy   2. H/O placenta previa   3. Abdominal pain affecting pregnancy    DC home Comfort measure reviewed  3rd Trimester precautions  PTL precautions  Fetal kick counts RX: none  Return to MAU as needed FU with OB as planned  Follow-up Information    Levi Aland, MD Follow up.   Specialty:  Obstetrics and Gynecology Contact information: [address] [place] [address] [phone]            Christine Greene 06/26/2016, 9:21 PM

## 2016-07-01 NOTE — L&D Delivery Note (Signed)
Delivery Note At 11:34 AM a viable and healthy female was delivered via Vaginal, Spontaneous Delivery (Presentation:left occiput ;anterior  ).  APGAR: 8,9 ; weight pending .   Placenta status:spontaneous, intact.  Cord: 3V    Anesthesia:  Epidural Episiotomy: None Lacerations: 2nd degree;Perineal Suture Repair: 3.0 vicryl Est. Blood Loss (mL): 350  Mom to postpartum.  Baby to Couplet care / Skin to Skin.  Shyler Holzman H. 09/05/2016, 12:29 PM

## 2016-07-21 ENCOUNTER — Encounter (HOSPITAL_COMMUNITY): Payer: Self-pay | Admitting: *Deleted

## 2016-07-21 ENCOUNTER — Inpatient Hospital Stay (HOSPITAL_COMMUNITY)
Admission: AD | Admit: 2016-07-21 | Discharge: 2016-07-21 | Disposition: A | Discharge status: Home / Self Care | Payer: Medicaid Other | Source: Ambulatory Visit | Attending: Obstetrics | Admitting: Obstetrics

## 2016-07-21 DIAGNOSIS — N760 Acute vaginitis: Secondary | ICD-10-CM | POA: Diagnosis not present

## 2016-07-21 DIAGNOSIS — B9689 Other specified bacterial agents as the cause of diseases classified elsewhere: Secondary | ICD-10-CM | POA: Insufficient documentation

## 2016-07-21 DIAGNOSIS — O4700 False labor before 37 completed weeks of gestation, unspecified trimester: Secondary | ICD-10-CM

## 2016-07-21 DIAGNOSIS — O26893 Other specified pregnancy related conditions, third trimester: Secondary | ICD-10-CM | POA: Diagnosis present

## 2016-07-21 DIAGNOSIS — O9989 Other specified diseases and conditions complicating pregnancy, childbirth and the puerperium: Secondary | ICD-10-CM

## 2016-07-21 DIAGNOSIS — O479 False labor, unspecified: Secondary | ICD-10-CM

## 2016-07-21 DIAGNOSIS — O98813 Other maternal infectious and parasitic diseases complicating pregnancy, third trimester: Secondary | ICD-10-CM | POA: Diagnosis not present

## 2016-07-21 DIAGNOSIS — Z3A30 30 weeks gestation of pregnancy: Secondary | ICD-10-CM | POA: Diagnosis not present

## 2016-07-21 DIAGNOSIS — R109 Unspecified abdominal pain: Secondary | ICD-10-CM | POA: Diagnosis present

## 2016-07-21 DIAGNOSIS — O4703 False labor before 37 completed weeks of gestation, third trimester: Secondary | ICD-10-CM | POA: Insufficient documentation

## 2016-07-21 LAB — WET PREP, GENITAL
Sperm: NONE SEEN
TRICH WET PREP: NONE SEEN
YEAST WET PREP: NONE SEEN

## 2016-07-21 LAB — URINALYSIS, ROUTINE W REFLEX MICROSCOPIC
BILIRUBIN URINE: NEGATIVE
Glucose, UA: 50 mg/dL — AB
Hgb urine dipstick: NEGATIVE
KETONES UR: 5 mg/dL — AB
Nitrite: NEGATIVE
PROTEIN: NEGATIVE mg/dL
Specific Gravity, Urine: 1.009 (ref 1.005–1.030)
pH: 6 (ref 5.0–8.0)

## 2016-07-21 LAB — POCT FERN TEST: POCT Fern Test: NEGATIVE

## 2016-07-21 LAB — FETAL FIBRONECTIN: Fetal Fibronectin: NEGATIVE

## 2016-07-21 MED ORDER — METRONIDAZOLE 500 MG PO TABS
500.0000 mg | ORAL_TABLET | Freq: Once | ORAL | Status: AC
  Administered 2016-07-21: 500 mg via ORAL
  Filled 2016-07-21: qty 1

## 2016-07-21 MED ORDER — METRONIDAZOLE 500 MG PO TABS: 500.0000 mg | ORAL_TABLET | Freq: Two times a day (BID) | ORAL | 0 refills | Status: DC

## 2016-07-21 NOTE — MAU Provider Note (Signed)
History     CSN: [id number]  Arrival date and time: 07/21/16 2021   First Provider Initiated Contact with Patient 07/21/16 2106      Chief Complaint  Patient presents with  . Contractions   Patient is a 31 year old G8 P3 who presents to the MA U tonight at 30 weeks and 4 days with abdominal cramping can concern for rupture of membranes. She reports she is lying on the couch today and she had a small gush of fluid she has had no leaking since then. She reports regular fetal movement. She reports for the past several days she's had some cramping sensations and tightening in her abdomen. She was seen in her physician's office on Tuesday and her cervix was closed. Today she reports the tightening got significantly more intense and she felt some vaginal pressure that she rates a 3 out of 10 this similar to when she had her twins early. She had no other concerns or complaints today. She reports no vaginal discharge. She reports no recent intercourse.    OB History    Gravida Para Term Preterm AB Living   8 2 1 1 5 3    SAB TAB Ectopic Multiple Live Births   4 0 1 1 2       Past Medical History:  Diagnosis Date  . Preterm labor     Past Surgical History:  Procedure Laterality Date  . DILATION AND CURETTAGE OF UTERUS    . LAPAROSCOPY FOR ECTOPIC PREGNANCY    . VAGINAL DELIVERY  11/05/2011   Procedure: VAGINAL DELIVERY;  Surgeon: Levi AlandMark E Anderson, MD;  Location: [facility];  Service: Gynecology;  Laterality: N/A;    Family History  Problem Relation Age of Onset  . Diabetes Father     Social History  Substance Use Topics  . Smoking status: Never Smoker  . Smokeless tobacco: Never Used  . Alcohol use No    Allergies: No Known Allergies  Prescriptions Prior to Admission  Medication Sig Dispense Refill Last Dose  . docusate sodium (COLACE) 100 MG capsule Take 1 capsule (100 mg total) by mouth 2 (two) times daily. 60 capsule 0 07/20/2016 at Unknown time  . escitalopram (LEXAPRO) 10 MG  tablet Take 10 mg by mouth daily.   07/20/2016 at Unknown time  . esomeprazole (NEXIUM) 40 MG capsule Take 40 mg by mouth daily at 12 noon.   07/21/2016 at Unknown time  . ondansetron (ZOFRAN-ODT) 4 MG disintegrating tablet Take 4 mg by mouth every 8 (eight) hours as needed for nausea or vomiting.   07/21/2016 at Unknown time  . Prenatal Vit-Fe Fumarate-FA (PRENATAL MULTIVITAMIN) TABS Take 1 tablet by mouth every morning.   07/20/2016 at Unknown time    Review of Systems  Constitutional: Negative for chills, fatigue and fever.  Respiratory: Negative for cough and shortness of breath.   Cardiovascular: Negative for chest pain and palpitations.  Gastrointestinal: Positive for abdominal pain. Negative for abdominal distention, diarrhea and nausea.  Genitourinary: Negative for difficulty urinating, dysuria and flank pain.  Neurological: Negative for dizziness and tremors.   Physical Exam   Blood pressure 124/74, pulse 105, temperature 98.7 F (37.1 C), temperature source Oral, resp. rate 16, last menstrual period 12/11/2014, unknown if currently breastfeeding.  Physical Exam  Constitutional: She is oriented to person, place, and time. She appears well-developed and well-nourished.  HENT:  Head: Normocephalic and atraumatic.  Eyes: Conjunctivae are normal. Pupils are equal, round, and reactive to light.  Cardiovascular: Normal rate and  intact distal pulses.   Respiratory: Effort normal.  GI: Soft. She exhibits no distension. There is no tenderness. There is no rebound and no guarding.  Genitourinary:  Genitourinary Comments: Significant thick mucus discharge in the vaginal vault, no pooling, cervix open and external os to about 1 but closed internally. No cervical motion tenderness.  Musculoskeletal: Normal range of motion.  Neurological: She is alert and oriented to person, place, and time. No cranial nerve deficit.  Skin: Skin is warm and dry.  Psychiatric: She has a normal mood and affect.  Her behavior is normal.    MAU Course  Procedures  MDM In MA U patient underwent testing with Crist Fat which was negative Wet prep which revealed bacterial vaginosis Gonorrhea and Chlamydia testing which is pending Fetal fibronectin which is negative  Additionally patient underwent approximately an hour of continuous fetal monitoring which showed a baseline heart rate at approximately 145 with appropriate accelerations for gestational age. There is some minimal uterine irritability but no distinct contractions.  Case was discussed with Dr. Chestine Spore who agreed with treatment of bacterial vaginosis and discharge home.  Assessment and Plan  #1: Bacterial vaginosis treated with Flagyl 500 mg twice a day for 7 days patient was given one dose flagyl in MAU #2: Preterm contractions: Not picking up on the monitor fibronectin negative likely contractions are not clinically significant.  Ernestina Penna 07/21/2016, 9:24 PM

## 2016-07-21 NOTE — MAU Note (Signed)
Pt presents complaining of contractions every 6-15 minutes that started today. Having more pressure. Unsure if she is leaking fluid or not. Sharp stabbing pain between her legs. Denies bleeding. Reports good fetal movement.

## 2016-07-22 LAB — GC/CHLAMYDIA PROBE AMP (~~LOC~~) NOT AT ARMC
Chlamydia: NEGATIVE
Neisseria Gonorrhea: NEGATIVE

## 2016-07-24 ENCOUNTER — Inpatient Hospital Stay (HOSPITAL_COMMUNITY)
Admission: AD | Admit: 2016-07-24 | Discharge: 2016-07-24 | Disposition: A | Discharge status: Home / Self Care | Payer: Medicaid Other | Source: Ambulatory Visit | Attending: Obstetrics & Gynecology | Admitting: Obstetrics & Gynecology

## 2016-07-24 DIAGNOSIS — Z3A00 Weeks of gestation of pregnancy not specified: Secondary | ICD-10-CM | POA: Insufficient documentation

## 2016-07-24 MED ORDER — BETAMETHASONE SOD PHOS & ACET 6 (3-3) MG/ML IJ SUSP
12.0000 mg | Freq: Once | INTRAMUSCULAR | Status: AC
  Administered 2016-07-24: 12 mg via INTRAMUSCULAR
  Filled 2016-07-24: qty 2

## 2016-07-25 ENCOUNTER — Inpatient Hospital Stay (HOSPITAL_COMMUNITY)
Admission: AD | Admit: 2016-07-25 | Discharge: 2016-07-25 | Disposition: A | Discharge status: Home / Self Care | Payer: Medicaid Other | Source: Ambulatory Visit | Attending: Obstetrics | Admitting: Obstetrics

## 2016-07-25 ENCOUNTER — Encounter (HOSPITAL_COMMUNITY): Payer: Self-pay

## 2016-07-25 DIAGNOSIS — O4703 False labor before 37 completed weeks of gestation, third trimester: Secondary | ICD-10-CM | POA: Insufficient documentation

## 2016-07-25 DIAGNOSIS — R102 Pelvic and perineal pain: Secondary | ICD-10-CM | POA: Diagnosis present

## 2016-07-25 DIAGNOSIS — O479 False labor, unspecified: Secondary | ICD-10-CM | POA: Diagnosis not present

## 2016-07-25 DIAGNOSIS — O26893 Other specified pregnancy related conditions, third trimester: Secondary | ICD-10-CM | POA: Diagnosis present

## 2016-07-25 DIAGNOSIS — Z3A31 31 weeks gestation of pregnancy: Secondary | ICD-10-CM | POA: Diagnosis present

## 2016-07-25 LAB — URINALYSIS, ROUTINE W REFLEX MICROSCOPIC
Bilirubin Urine: NEGATIVE
Glucose, UA: >=500 mg/dL — AB
HGB URINE DIPSTICK: NEGATIVE
Ketones, ur: 20 mg/dL — AB
Leukocytes, UA: NEGATIVE
NITRITE: NEGATIVE
Protein, ur: 30 mg/dL — AB
SPECIFIC GRAVITY, URINE: 1.02 (ref 1.005–1.030)
pH: 5 (ref 5.0–8.0)

## 2016-07-25 MED ORDER — NIFEDIPINE 10 MG PO CAPS
10.0000 mg | ORAL_CAPSULE | Freq: Once | ORAL | Status: AC
  Administered 2016-07-25: 10 mg via ORAL
  Filled 2016-07-25: qty 1

## 2016-07-25 MED ORDER — BETAMETHASONE SOD PHOS & ACET 6 (3-3) MG/ML IJ SUSP
12.0000 mg | Freq: Once | INTRAMUSCULAR | Status: AC
  Administered 2016-07-25: 12 mg via INTRAMUSCULAR
  Filled 2016-07-25: qty 2

## 2016-07-25 MED ORDER — NIFEDIPINE 10 MG PO CAPS: 10.0000 mg | ORAL_CAPSULE | Freq: Four times a day (QID) | ORAL | 3 refills | Status: DC | PRN

## 2016-07-25 NOTE — MAU Provider Note (Signed)
History     CSN: [id number]  Arrival date and time: 07/25/16 2156   First Provider Initiated Contact with Patient 07/25/16 2245      Chief Complaint  Patient presents with  . Contractions   Christine Greene is a 31 y.o.  [id number] at 7955w1d who presents today with contractions. She states that she has had them off and on today. She was not having them while she was here for BMZ today. She reports her pain 5/10 when she has a contraction. She states that at home they were about every 7 mins. She reports no change since being here. She reports normal fetal movement. Next appointment 07/30/16.  Pelvic Pain  The patient's primary symptoms include pelvic pain. The patient's pertinent negatives include no vaginal discharge. This is a new problem. The current episode started today. The problem occurs intermittently. The problem has been unchanged. Pain severity now: 5/10  She is pregnant. Pertinent negatives include no chills, fever, nausea or vomiting. The vaginal discharge was normal. There has been no bleeding. Nothing aggravates the symptoms. She has tried nothing for the symptoms.   Past Medical History:  Diagnosis Date  . Preterm labor     Past Surgical History:  Procedure Laterality Date  . DILATION AND CURETTAGE OF UTERUS    . LAPAROSCOPY FOR ECTOPIC PREGNANCY    . VAGINAL DELIVERY  11/05/2011   Procedure: VAGINAL DELIVERY;  Surgeon: Levi AlandMark E Anderson, MD;  Location: [facility];  Service: Gynecology;  Laterality: N/A;    Family History  Problem Relation Age of Onset  . Diabetes Father     Social History  Substance Use Topics  . Smoking status: Never Smoker  . Smokeless tobacco: Never Used  . Alcohol use No    Allergies: No Known Allergies  Prescriptions Prior to Admission  Medication Sig Dispense Refill Last Dose  . acetaminophen (TYLENOL) 500 MG tablet Take 1,000 mg by mouth every 6 (six) hours as needed for mild pain.   07/24/2016 at Unknown time  . docusate sodium (COLACE)  100 MG capsule Take 1 capsule (100 mg total) by mouth 2 (two) times daily. 60 capsule 0 07/24/2016 at Unknown time  . escitalopram (LEXAPRO) 10 MG tablet Take 10 mg by mouth at bedtime.    07/24/2016 at Unknown time  . esomeprazole (NEXIUM) 40 MG capsule Take 40 mg by mouth at bedtime.    07/24/2016 at Unknown time  . metroNIDAZOLE (FLAGYL) 500 MG tablet Take 1 tablet (500 mg total) by mouth 2 (two) times daily. 14 tablet 0 07/25/2016 at Unknown time  . ondansetron (ZOFRAN-ODT) 4 MG disintegrating tablet Take 4 mg by mouth every 8 (eight) hours as needed for nausea or vomiting.   07/24/2016 at Unknown time  . Prenatal Vit-Fe Fumarate-FA (PRENATAL MULTIVITAMIN) TABS Take 1 tablet by mouth at bedtime.    07/24/2016 at Unknown time    Review of Systems  Constitutional: Negative for chills and fever.  Gastrointestinal: Negative for nausea and vomiting.  Genitourinary: Positive for pelvic pain. Negative for vaginal bleeding and vaginal discharge.   Physical Exam   Blood pressure 115/70, pulse 98, temperature 98.4 F (36.9 C), temperature source Oral, resp. rate 16, height 4\' 11"  (1.499 m), weight 179 lb (81.2 kg), last menstrual period 12/11/2014, SpO2 100 %, unknown if currently breastfeeding.  Physical Exam  Nursing note and vitals reviewed. Constitutional: She is oriented to person, place, and time. She appears well-developed and well-nourished. No distress.  HENT:  Head: Normocephalic.  Cardiovascular: Normal rate.   Respiratory: Effort normal.  GI: Soft. There is no tenderness. There is no rebound.  Genitourinary:  Genitourinary Comments: Cervix: FT@ext  os/closed @int  os/thick/soft/ballotable   Neurological: She is alert and oriented to person, place, and time.  Skin: Skin is warm and dry.  Psychiatric: She has a normal mood and affect.   FHT: 130, moderate with 15x15 accels, no decels Toco: no UCs, but possibly some UI MAU Course  Procedures  MDM 2318: D/W Dr. Chestine Spore ok for DC home  with procardia   Assessment and Plan   1. Braxton Hicks contractions   2. [redacted] weeks gestation of pregnancy    DC home Comfort measures reviewed  3rd Trimester precautions  PTL precautions  Fetal kick counts RX: procardia 10mg  q6 hours PRN #30  Return to MAU as needed FU with OB as planned  Follow-up Information    [facility] Lizabeth Leyden, MD Follow up.   Specialty:  Obstetrics Contact information: [address] [place] [address] [place] [address] [phone]            Tawnya Crook 07/25/2016, 10:48 PM

## 2016-07-25 NOTE — MAU Note (Signed)
Pt reports back pain and contractions,worsening this pm. Had second dose of BMZ today.

## 2016-07-25 NOTE — MAU Note (Signed)
Pt here for 2nd betamethasone injection. Pt reprots she stll  Is having some ctx but her back is hurting now. Ctx about every 15 min with rest moe often when she is walking. Notified Dr. Chestine Sporelark and she said to ask the pt if these felt any different than she has been expeireing the past few week and if it would have made her come in to be checked out. Discussed with pt and she decided to go eat and see if she felt better. She will return if she feels the contraction are stronger or more painful

## 2016-07-25 NOTE — Discharge Instructions (Signed)
Braxton Hicks Contractions °Contractions of the uterus can occur throughout pregnancy. Contractions are not always a sign that you are in labor.  °WHAT ARE BRAXTON HICKS CONTRACTIONS?  °Contractions that occur before labor are called Braxton Hicks contractions, or false labor. Toward the end of pregnancy (32-34 weeks), these contractions can develop more often and may become more forceful. This is not true labor because these contractions do not result in opening (dilatation) and thinning of the cervix. They are sometimes difficult to tell apart from true labor because these contractions can be forceful and people have different pain tolerances. You should not feel embarrassed if you go to the hospital with false labor. Sometimes, the only way to tell if you are in true labor is for your health care provider to look for changes in the cervix. °If there are no prenatal problems or other health problems associated with the pregnancy, it is completely safe to be sent home with false labor and await the onset of true labor. °HOW CAN YOU TELL THE DIFFERENCE BETWEEN TRUE AND FALSE LABOR? °False Labor  °· The contractions of false labor are usually shorter and not as hard as those of true labor.   °· The contractions are usually irregular.   °· The contractions are often felt in the front of the lower abdomen and in the groin.   °· The contractions may go away when you walk around or change positions while lying down.   °· The contractions get weaker and are shorter lasting as time goes on.   °· The contractions do not usually become progressively stronger, regular, and closer together as with true labor.   °True Labor  °· Contractions in true labor last 30-70 seconds, become very regular, usually become more intense, and increase in frequency.   °· The contractions do not go away with walking.   °· The discomfort is usually felt in the top of the uterus and spreads to the lower abdomen and low back.   °· True labor can be  determined by your health care provider with an exam. This will show that the cervix is dilating and getting thinner.   °WHAT TO REMEMBER °· Keep up with your usual exercises and follow other instructions given by your health care provider.   °· Take medicines as directed by your health care provider.   °· Keep your regular prenatal appointments.   °· Eat and drink lightly if you think you are going into labor.   °· If Braxton Hicks contractions are making you uncomfortable:   °¨ Change your position from lying down or resting to walking, or from walking to resting.   °¨ Sit and rest in a tub of warm water.   °¨ Drink 2-3 glasses of water. Dehydration may cause these contractions.   °¨ Do slow and deep breathing several times an hour.   °WHEN SHOULD I SEEK IMMEDIATE MEDICAL CARE? °Seek immediate medical care if: °· Your contractions become stronger, more regular, and closer together.   °· You have fluid leaking or gushing from your vagina.   °· You have a fever.   °· You pass blood-tinged mucus.   °· You have vaginal bleeding.   °· You have continuous abdominal pain.   °· You have low back pain that you never had before.   °· You feel your baby's head pushing down and causing pelvic pressure.   °· Your baby is not moving as much as it used to.   °This information is not intended to replace advice given to you by your health care provider. Make sure you discuss any questions you have with your health care   provider. Document Released: 06/17/2005 Document Revised: 10/09/2015 Document Reviewed: 03/29/2013 Elsevier Interactive Patient Education  2017 [employer]  Preterm Labor and Birth Information The normal length of a pregnancy is 39-41 weeks. Preterm labor is when labor starts before 37 completed weeks of pregnancy. What are the risk factors for preterm labor? Preterm labor is more likely to occur in women who:  Have certain infections during pregnancy such as a bladder infection, sexually transmitted  infection, or infection inside the uterus (chorioamnionitis).  Have a shorter-than-normal cervix.  Have gone into preterm labor before.  Have had surgery on their cervix.  Are younger than age 33 or older than age 33.  Are African American.  Are pregnant with twins or multiple babies (multiple gestation).  Take street drugs or smoke while pregnant.  Do not gain enough weight while pregnant.  Became pregnant shortly after having been pregnant. What are the symptoms of preterm labor? Symptoms of preterm labor include:  Cramps similar to those that can happen during a menstrual period. The cramps may happen with diarrhea.  Pain in the abdomen or lower back.  Regular uterine contractions that may feel like tightening of the abdomen.  A feeling of increased pressure in the pelvis.  Increased watery or bloody mucus discharge from the vagina.  Water breaking (ruptured amniotic sac). Why is it important to recognize signs of preterm labor? It is important to recognize signs of preterm labor because babies who are born prematurely may not be fully developed. This can put them at an increased risk for:  Long-term (chronic) heart and lung problems.  Difficulty immediately after birth with regulating body systems, including blood sugar, body temperature, heart rate, and breathing rate.  Bleeding in the brain.  Cerebral palsy.  Learning difficulties.  Death. These risks are highest for babies who are born before 34 weeks of pregnancy. How is preterm labor treated? Treatment depends on the length of your pregnancy, your condition, and the health of your baby. It may involve:  Having a stitch (suture) placed in your cervix to prevent your cervix from opening too early (cerclage).  Taking or being given medicines, such as:  Hormone medicines. These may be given early in pregnancy to help support the pregnancy.  Medicine to stop contractions.  Medicines to help mature the  babys lungs. These may be prescribed if the risk of delivery is high.  Medicines to prevent your baby from developing cerebral palsy. If the labor happens before 34 weeks of pregnancy, you may need to stay in the hospital. What should I do if I think I am in preterm labor? If you think that you are going into preterm labor, call your health care provider right away. How can I prevent preterm labor in future pregnancies? To increase your chance of having a full-term pregnancy:  Do not use any tobacco products, such as cigarettes, chewing tobacco, and e-cigarettes. If you need help quitting, ask your health care provider.  Do not use street drugs or medicines that have not been prescribed to you during your pregnancy.  Talk with your health care provider before taking any herbal supplements, even if you have been taking them regularly.  Make sure you gain a healthy amount of weight during your pregnancy.  Watch for infection. If you think that you might have an infection, get it checked right away.  Make sure to tell your health care provider if you have gone into preterm labor before. This information is not intended to replace  advice given to you by your health care provider. Make sure you discuss any questions you have with your health care provider. Document Released: 09/07/2003 Document Revised: 11/28/2015 Document Reviewed: 11/08/2015 Elsevier Interactive Patient Education  2017 [employer].

## 2016-07-25 NOTE — MAU Note (Signed)
Pt reports contractions every 7 mins at home, but now seem to have spread out. Rates 4/10. Here earlier for 2nd BMZ. Denies LOF or vag bleeding. +FM.

## 2016-08-14 ENCOUNTER — Encounter (HOSPITAL_COMMUNITY): Payer: Self-pay | Admitting: *Deleted

## 2016-08-14 ENCOUNTER — Inpatient Hospital Stay (HOSPITAL_COMMUNITY)
Admission: AD | Admit: 2016-08-14 | Discharge: 2016-08-15 | DRG: 780 | Disposition: A | Discharge status: Home / Self Care | Payer: Medicaid Other | Source: Ambulatory Visit | Attending: Obstetrics & Gynecology | Admitting: Obstetrics & Gynecology

## 2016-08-14 DIAGNOSIS — R030 Elevated blood-pressure reading, without diagnosis of hypertension: Secondary | ICD-10-CM | POA: Diagnosis present

## 2016-08-14 DIAGNOSIS — O4703 False labor before 37 completed weeks of gestation, third trimester: Principal | ICD-10-CM | POA: Diagnosis present

## 2016-08-14 DIAGNOSIS — Z3A34 34 weeks gestation of pregnancy: Secondary | ICD-10-CM

## 2016-08-14 HISTORY — DX: False labor before 37 completed weeks of gestation, third trimester: O47.03

## 2016-08-14 LAB — URINALYSIS, ROUTINE W REFLEX MICROSCOPIC
BILIRUBIN URINE: NEGATIVE
Glucose, UA: NEGATIVE mg/dL
HGB URINE DIPSTICK: NEGATIVE
Ketones, ur: 20 mg/dL — AB
Leukocytes, UA: NEGATIVE
Nitrite: NEGATIVE
PROTEIN: NEGATIVE mg/dL
Specific Gravity, Urine: 1.013 (ref 1.005–1.030)
pH: 6 (ref 5.0–8.0)

## 2016-08-14 LAB — CBC
HEMATOCRIT: 33.1 % — AB (ref 36.0–46.0)
Hemoglobin: 11.3 g/dL — ABNORMAL LOW (ref 12.0–15.0)
MCH: 29.8 pg (ref 26.0–34.0)
MCHC: 34.1 g/dL (ref 30.0–36.0)
MCV: 87.3 fL (ref 78.0–100.0)
PLATELETS: 156 10*3/uL (ref 150–400)
RBC: 3.79 MIL/uL — ABNORMAL LOW (ref 3.87–5.11)
RDW: 14.2 % (ref 11.5–15.5)
WBC: 8.7 10*3/uL (ref 4.0–10.5)

## 2016-08-14 LAB — COMPREHENSIVE METABOLIC PANEL
ALBUMIN: 3.3 g/dL — AB (ref 3.5–5.0)
ALT: 11 U/L — ABNORMAL LOW (ref 14–54)
AST: 18 U/L (ref 15–41)
Alkaline Phosphatase: 89 U/L (ref 38–126)
Anion gap: 13 (ref 5–15)
BUN: 7 mg/dL (ref 6–20)
CO2: 21 mmol/L — AB (ref 22–32)
Calcium: 9.2 mg/dL (ref 8.9–10.3)
Chloride: 102 mmol/L (ref 101–111)
Creatinine, Ser: 0.37 mg/dL — ABNORMAL LOW (ref 0.44–1.00)
GFR calc Af Amer: >60 mL/min (ref >60)
GFR calc non Af Amer: >60 mL/min (ref >60)
GLUCOSE: 114 mg/dL — AB (ref 65–99)
POTASSIUM: 3.6 mmol/L (ref 3.5–5.1)
SODIUM: 136 mmol/L (ref 135–145)
Total Bilirubin: 0.2 mg/dL — ABNORMAL LOW (ref 0.3–1.2)
Total Protein: 7 g/dL (ref 6.5–8.1)

## 2016-08-14 LAB — PROTEIN / CREATININE RATIO, URINE
Creatinine, Urine: 73 mg/dL
Protein Creatinine Ratio: 0.16 mg/mg{Cre} — ABNORMAL HIGH (ref 0.00–0.15)
Total Protein, Urine: 12 mg/dL

## 2016-08-14 MED ORDER — CALCIUM CARBONATE ANTACID 500 MG PO CHEW: 2.0000 | CHEWABLE_TABLET | ORAL | Status: DC | PRN

## 2016-08-14 MED ORDER — NALBUPHINE HCL 10 MG/ML IJ SOLN: 10.0000 mg | INTRAMUSCULAR | Status: DC | PRN

## 2016-08-14 MED ORDER — TERBUTALINE SULFATE 1 MG/ML IJ SOLN: 0.2500 mg | Freq: Once | INTRAMUSCULAR | Status: DC

## 2016-08-14 MED ORDER — PRENATAL MULTIVITAMIN CH
1.0000 | ORAL_TABLET | Freq: Every day | ORAL | Status: DC
  Filled 2016-08-14: qty 1

## 2016-08-14 MED ORDER — ACETAMINOPHEN 325 MG PO TABS: 650.0000 mg | ORAL_TABLET | ORAL | Status: DC | PRN

## 2016-08-14 MED ORDER — LACTATED RINGERS IV SOLN
INTRAVENOUS | Status: DC
  Administered 2016-08-14: 22:00:00 via INTRAVENOUS

## 2016-08-14 MED ORDER — ZOLPIDEM TARTRATE 5 MG PO TABS: 5.0000 mg | ORAL_TABLET | Freq: Every evening | ORAL | Status: DC | PRN

## 2016-08-14 MED ORDER — DOCUSATE SODIUM 100 MG PO CAPS
100.0000 mg | ORAL_CAPSULE | Freq: Every day | ORAL | Status: DC
  Filled 2016-08-14 (×2): qty 1

## 2016-08-14 NOTE — H&P (Signed)
Anterpartum History and Physical  [name] is 11/10/1985  Patient's last menstrual period was 12/11/2014. [id number]  Christine Greene is a 31 y.o. female [phone] at 34 weeks presented to MAU for regular painful contractions. Patient notes That she has been having contractions for weeks now, but today they have become more regular and more intense.  She denies leaking of fluid, no vaginal bleeding. She reports normal fetal movements.  She had a headache earlier, no RUQ pain, no blurry vision, no scotomata  Pregnancy complicated by Preterm contractions - shortened cervix on ultrasound  PMHX  Past Medical History:  Diagnosis Date  . Preterm labor     PSURG HX  Past Surgical History:  Procedure Laterality Date  . DILATION AND CURETTAGE OF UTERUS    . LAPAROSCOPY FOR ECTOPIC PREGNANCY    . VAGINAL DELIVERY  11/05/2011   Procedure: VAGINAL DELIVERY;  Surgeon: Levi AlandMark E Anderson, MD;  Location: [facility];  Service: Gynecology;  Laterality: N/A;    FAM HX  Family History  Problem Relation Age of Onset  . Diabetes Father     SOC HX  Social History   Social History  . Marital status: Married    Spouse name: N/A  . Number of children: N/A  . Years of education: N/A   Occupational History  . Not on file.   Social History Main Topics  . Smoking status: Never Smoker  . Smokeless tobacco: Never Used  . Alcohol use No  . Drug use: No  . Sexual activity: Not Currently    Birth control/ protection: None   Other Topics Concern  . Not on file   Social History Narrative  . No narrative on file    ROS  Review of Systems - As per HPI  US done in office yesterday: vertex, Low lying placenta RESOLVED AFI normal   LABS CBC    Component Value Date/Time   WBC 8.7 08/14/2016 1930   RBC 3.79 (L) 08/14/2016 1930   HGB 11.3 (L) 08/14/2016 1930   HCT 33.1 (L) 08/14/2016 1930   PLT 156 08/14/2016 1930   MCV 87.3 08/14/2016 1930   MCH 29.8 08/14/2016 1930   MCHC 34.1 08/14/2016  1930   RDW 14.2 08/14/2016 1930   LYMPHSABS 1.9 06/28/2015 1617   MONOABS 0.6 06/28/2015 1617   EOSABS 0.1 06/28/2015 1617   BASOSABS 0.0 06/28/2015 1617     EXAM: BP: SBP: range 110-152 / DBP: 80s-90s  Gen WDWN  IN NAD ABD Gravid soft NT GU 1/50/-2    Assessment: 31 yo P3 at 34 weeks with regular painful preterm contractions. She also has had several elevated BPs Preeclampsia labs normal   Plan: 23 hour observation in antepartum Discussed with patient possibly using IV morphine for rest and she may transition into labor Will re-check her cervix again in am or if pain worsens Monitor BPs closely, patient counseled that she may have gestational htn. Patient states that she doesn't tolerate procardia that she was given as outpatient.  Offered terb x 1 dose to patient to quiet her uterus and give her relief she declined.   Christine Greene STACIA

## 2016-08-14 NOTE — MAU Provider Note (Signed)
History     CSN: [id number]  Arrival date and time: 08/14/16 1831   First Provider Initiated Contact with Patient 08/14/16 1920      Chief Complaint  Patient presents with  . pelvic pressure   HPI   Christine Greene is a 31 y.o. female [phone] @ [redacted]w[redacted]d here in MAU with pelvic pain. The pain started a few days ago, however has gotten worse today. "it is almost unbearable to walk". She received betamethasone for threatened preterm labor. Denies vaginal bleeding. + fetal movement.  Had a cervical exam yesterday in the office.  Occasional HA, none now.   OB History    Gravida Para Term Preterm AB Living   8 2 1 1 5 3    SAB TAB Ectopic Multiple Live Births   4 0 1 1 3       Past Medical History:  Diagnosis Date  . Preterm labor     Past Surgical History:  Procedure Laterality Date  . DILATION AND CURETTAGE OF UTERUS    . LAPAROSCOPY FOR ECTOPIC PREGNANCY    . VAGINAL DELIVERY  11/05/2011   Procedure: VAGINAL DELIVERY;  Surgeon: Levi Aland, MD;  Location: [facility];  Service: Gynecology;  Laterality: N/A;    Family History  Problem Relation Age of Onset  . Diabetes Father     Social History  Substance Use Topics  . Smoking status: Never Smoker  . Smokeless tobacco: Never Used  . Alcohol use No    Allergies: No Known Allergies  Prescriptions Prior to Admission  Medication Sig Dispense Refill Last Dose  . acetaminophen (TYLENOL) 500 MG tablet Take 1,000 mg by mouth every 6 (six) hours as needed for mild pain.   Past Week at Unknown time  . docusate sodium (COLACE) 100 MG capsule Take 1 capsule (100 mg total) by mouth 2 (two) times daily. 60 capsule 0 08/13/2016 at Unknown time  . escitalopram (LEXAPRO) 10 MG tablet Take 10 mg by mouth at bedtime.    Past Week at Unknown time  . esomeprazole (NEXIUM) 40 MG capsule Take 40 mg by mouth at bedtime.    08/13/2016 at Unknown time  . NIFEdipine (PROCARDIA) 10 MG capsule Take 1 capsule (10 mg total) by mouth every 6 (six)  hours as needed. (Patient taking differently: Take 10 mg by mouth every 6 (six) hours as needed (contractions). ) 30 capsule 3 Past Week at Unknown time  . ondansetron (ZOFRAN-ODT) 4 MG disintegrating tablet Take 4 mg by mouth every 8 (eight) hours as needed for nausea or vomiting.   Past Month at Unknown time  . Prenatal Vit-Fe Fumarate-FA (PRENATAL MULTIVITAMIN) TABS Take 1 tablet by mouth at bedtime.    08/13/2016 at Unknown time  . metroNIDAZOLE (FLAGYL) 500 MG tablet Take 1 tablet (500 mg total) by mouth 2 (two) times daily. (Patient not taking: Reported on 08/14/2016) 14 tablet 0 Not Taking at Unknown time   Results for orders placed or performed during the hospital encounter of 08/14/16 (from the past 24 hour(s))  Urinalysis, Routine w reflex microscopic     Status: Abnormal   Collection Time: 08/14/16  6:40 PM  Result Value Ref Range   Color, Urine YELLOW YELLOW   APPearance CLEAR CLEAR   Specific Gravity, Urine 1.013 1.005 - 1.030   pH 6.0 5.0 - 8.0   Glucose, UA NEGATIVE NEGATIVE mg/dL   Hgb urine dipstick NEGATIVE NEGATIVE   Bilirubin Urine NEGATIVE NEGATIVE   Ketones, ur 20 (A) NEGATIVE  mg/dL   Protein, ur NEGATIVE NEGATIVE mg/dL   Nitrite NEGATIVE NEGATIVE   Leukocytes, UA NEGATIVE NEGATIVE   Review of Systems  Eyes: Negative for photophobia.  Gastrointestinal: Negative for abdominal pain.  Neurological: Negative for headaches (None currently ).   Physical Exam   Blood pressure 145/84, pulse 97, temperature 97.8 F (36.6 C), resp. rate 18, weight 180 lb (81.6 kg), last menstrual period 12/11/2014, unknown if currently breastfeeding.   Patient Vitals for the past 24 hrs:  BP Temp Pulse Resp Weight  08/14/16 1920 132/77 - 95 - -  08/14/16 1905 145/84 - 97 - -  08/14/16 1856 135/84 - 106 - -  08/14/16 1851 140/70 97.8 F (36.6 C) 104 18 180 lb (81.6 kg)  08/14/16 1850 140/70 - 89 - -  08/14/16 1848 152/78 - 103 - -    Physical Exam  Constitutional: She is oriented  to person, place, and time. She appears well-developed and well-nourished. No distress.  HENT:  Head: Normocephalic.  Respiratory: Effort normal.  GI: Soft. She exhibits no distension. There is no tenderness.  Genitourinary:  Genitourinary Comments: Dilation: Closed Effacement (%): 50 Exam by:: Venia Carbon NP   Musculoskeletal: Normal range of motion.  Neurological: She is alert and oriented to person, place, and time. She has normal reflexes.  Negative clonus   Skin: Skin is warm. She is not diaphoretic.  Psychiatric: Her behavior is normal.   Fetal Tracing: Baseline: 135 bpm  Variability: Moderate  Accelerations: 15x15 Decelerations: none Toco: Occasional contraction   Results for orders placed or performed during the hospital encounter of 08/14/16 (from the past 24 hour(s))  Urinalysis, Routine w reflex microscopic     Status: Abnormal   Collection Time: 08/14/16  6:40 PM  Result Value Ref Range   Color, Urine YELLOW YELLOW   APPearance CLEAR CLEAR   Specific Gravity, Urine 1.013 1.005 - 1.030   pH 6.0 5.0 - 8.0   Glucose, UA NEGATIVE NEGATIVE mg/dL   Hgb urine dipstick NEGATIVE NEGATIVE   Bilirubin Urine NEGATIVE NEGATIVE   Ketones, ur 20 (A) NEGATIVE mg/dL   Protein, ur NEGATIVE NEGATIVE mg/dL   Nitrite NEGATIVE NEGATIVE   Leukocytes, UA NEGATIVE NEGATIVE  Protein / creatinine ratio, urine     Status: Abnormal   Collection Time: 08/14/16  6:40 PM  Result Value Ref Range   Creatinine, Urine 73.00 mg/dL   Total Protein, Urine 12 mg/dL   Protein Creatinine Ratio 0.16 (H) 0.00 - 0.15 mg/mg[Cre]  CBC     Status: Abnormal   Collection Time: 08/14/16  7:30 PM  Result Value Ref Range   WBC 8.7 4.0 - 10.5 K/uL   RBC 3.79 (L) 3.87 - 5.11 MIL/uL   Hemoglobin 11.3 (L) 12.0 - 15.0 g/dL   HCT 21.3 (L) 08.6 - 57.8 %   MCV 87.3 78.0 - 100.0 fL   MCH 29.8 26.0 - 34.0 pg   MCHC 34.1 30.0 - 36.0 g/dL   RDW 46.9 62.9 - 52.8 %   Platelets 156 150 - 400 K/uL  Comprehensive  metabolic panel     Status: Abnormal   Collection Time: 08/14/16  7:30 PM  Result Value Ref Range   Sodium 136 135 - 145 mmol/L   Potassium 3.6 3.5 - 5.1 mmol/L   Chloride 102 101 - 111 mmol/L   CO2 21 (L) 22 - 32 mmol/L   Glucose, Bld 114 (H) 65 - 99 mg/dL   BUN 7 6 - 20 mg/dL  Creatinine, Ser 0.37 (L) 0.44 - 1.00 mg/dL   Calcium 9.2 8.9 - 19.110.3 mg/dL   Total Protein 7.0 6.5 - 8.1 g/dL   Albumin 3.3 (L) 3.5 - 5.0 g/dL   AST 18 15 - 41 U/L   ALT 11 (L) 14 - 54 U/L   Alkaline Phosphatase 89 38 - 126 U/L   Total Bilirubin 0.2 (L) 0.3 - 1.2 mg/dL   GFR calc non Af Amer >60 >60 mL/min   GFR calc Af Amer >60 >60 mL/min   Anion gap 13 5 - 15    MAU Course  Procedures  None  MDM  PIH labs pending  Report given Thressa ShellerHeather Hogan CNM who resumes care of the patient. PIH labs pending.  2102: D/W Dr. Mora ApplPinn, she is in house, and will stop by to see the patient.   Assessment and Plan  [redacted] weeks gestation Abdominal pain in pregnancy, third trimester   Tawnya CrookHogan, Heather Donovan  9:40 PM 08/14/16

## 2016-08-14 NOTE — MAU Note (Signed)
Pt presents to MAU with complaints of pelvic pressure that keeps getting worse. Watery mucous discharge this evening.

## 2016-08-15 DIAGNOSIS — O4703 False labor before 37 completed weeks of gestation, third trimester: Secondary | ICD-10-CM | POA: Diagnosis not present

## 2016-08-15 DIAGNOSIS — R030 Elevated blood-pressure reading, without diagnosis of hypertension: Secondary | ICD-10-CM | POA: Diagnosis present

## 2016-08-15 DIAGNOSIS — Z3A34 34 weeks gestation of pregnancy: Secondary | ICD-10-CM | POA: Diagnosis not present

## 2016-08-15 LAB — AMNISURE RUPTURE OF MEMBRANE (ROM) NOT AT ARMC: AMNISURE: NEGATIVE

## 2016-08-15 LAB — GROUP B STREP BY PCR: GROUP B STREP BY PCR: NEGATIVE

## 2016-08-15 MED ORDER — DIPHENHYDRAMINE HCL 50 MG/ML IJ SOLN
25.0000 mg | Freq: Every evening | INTRAMUSCULAR | Status: DC | PRN
  Administered 2016-08-15: 25 mg via INTRAVENOUS
  Filled 2016-08-15: qty 1

## 2016-08-15 MED ORDER — CYCLOBENZAPRINE HCL 5 MG PO TABS: 5.0000 mg | ORAL_TABLET | Freq: Three times a day (TID) | ORAL | 0 refills | Status: DC | PRN

## 2016-08-15 NOTE — Discharge Summary (Signed)
OB Discharge Summary     Patient Name: Christine Greene DOB: Aug 10, 1985 MRN: [id number]  Date of admission: 08/14/2016 Delivering MD: This patient has no babies on file.  Date of discharge: 08/15/2016  Admitting diagnosis: 34w pain and pressure Intrauterine pregnancy: [redacted]w[redacted]d     Secondary diagnosis:  Active Problems:   Threatened preterm labor, third trimester       Discharge diagnosis: Threatened preterm labor                                                                                                 Hospital course:  Patient admitted to antepartum for observation for preterm contractions and elevated bps in MAU. Contractions decreased in intensity with iv hydration. PPROM ruled out. Patient requested discharge home   Physical exam  Vitals:   08/14/16 2204 08/14/16 2220 08/15/16 0130 08/15/16 0535  BP: 124/83 124/74 128/71 106/60  Pulse: 72 77 80 78  Resp:   16 16  Temp:   97.8 F (36.6 C) 98 F (36.7 C)  TempSrc:   Oral Oral  Weight:       General: no distress Uterus: Non tender, gravid Cervix: 1cm / 60-70/-2 Labs: Lab Results  Component Value Date   WBC 8.7 08/14/2016   HGB 11.3 (L) 08/14/2016   HCT 33.1 (L) 08/14/2016   MCV 87.3 08/14/2016   PLT 156 08/14/2016   CMP Latest Ref Rng & Units 08/14/2016  Glucose 65 - 99 mg/dL 782(N)  BUN 6 - 20 mg/dL 7  Creatinine 5.62 - 1.30 mg/dL 8.65(H)  Sodium 846 - 962 mmol/L 136  Potassium 3.5 - 5.1 mmol/L 3.6  Chloride 101 - 111 mmol/L 102  CO2 22 - 32 mmol/L 21(L)  Calcium 8.9 - 10.3 mg/dL 9.2  Total Protein 6.5 - 8.1 g/dL 7.0  Total Bilirubin 0.3 - 1.2 mg/dL 9.5(M)  Alkaline Phos 38 - 126 U/L 89  AST 15 - 41 U/L 18  ALT 14 - 54 U/L 11(L)    Discharge instruction: per After Visit Summary and "Baby and Me Booklet".  After visit meds:  Allergies as of 08/15/2016   No Known Allergies     Medication List    STOP taking these medications   metroNIDAZOLE 500 MG tablet Commonly known as:  FLAGYL    NIFEdipine 10 MG capsule Commonly known as:  PROCARDIA     TAKE these medications   acetaminophen 500 MG tablet Commonly known as:  TYLENOL Take 1,000 mg by mouth every 6 (six) hours as needed for mild pain.   cyclobenzaprine 5 MG tablet Commonly known as:  FLEXERIL Take 1 tablet (5 mg total) by mouth 3 (three) times daily as needed for muscle spasms.   docusate sodium 100 MG capsule Commonly known as:  COLACE Take 1 capsule (100 mg total) by mouth 2 (two) times daily.   escitalopram 10 MG tablet Commonly known as:  LEXAPRO Take 10 mg by mouth at bedtime.   esomeprazole 40 MG capsule Commonly known as:  NEXIUM Take 40 mg by mouth at bedtime.   ondansetron 4 MG disintegrating tablet  Commonly known as:  ZOFRAN-ODT Take 4 mg by mouth every 8 (eight) hours as needed for nausea or vomiting.   prenatal multivitamin Tabs tablet Take 1 tablet by mouth at bedtime.       Diet: routine diet  Activity: Advance as tolerated. Pelvic rest   Outpatient follow up:1 week Follow up Appt:No future appointments. Follow up Visit:No Follow-up on file.  Patient still pregnant  08/15/2016 Wynonia HazardPINN, Chade Pitner STACIA, MD

## 2016-08-15 NOTE — Discharge Instructions (Signed)
PRETERM LABOR: Includes any of the following symptoms that occur between 20-[redacted] weeks gestation. If these symptoms are not stopped, preterm labor can result in preterm delivery, placing your baby at risk. ° °Notify your doctor if any of the following occur: °1. Menstrual-like cramps   5. Pelvic pressure  °2. Uterine contractions. These may be painless and feel like the uterus is tightening or the baby is "balling up" 6. Increase or change in vaginal discharge  °3. Low, dull backache, unrelieved by heat or Tylenol  7. Vaginal bleeding  °4. Intestinal cramps, with our without diarrhea, sometimes 8. A general feeling that "something is not right"  ° 9. Leaking of fluid described as "gas pain"  ° °

## 2016-08-15 NOTE — Progress Notes (Signed)
Patient had c/o possible leaking of fluid.  Amnisure and Crist FatFern was negative Contractions have spaced out and she would like to be discharged home  Will proceed with discharge with close f/u in the office  [facility], [facility] CenterWALDA STACIA

## 2016-08-17 LAB — TYPE AND SCREEN
ABO/RH(D): B NEG
ANTIBODY SCREEN: POSITIVE
DAT, IgG: NEGATIVE
UNIT DIVISION: 0
Unit division: 0

## 2016-08-17 LAB — CULTURE, BETA STREP (GROUP B ONLY)

## 2016-08-20 ENCOUNTER — Encounter (HOSPITAL_COMMUNITY): Payer: Self-pay | Admitting: *Deleted

## 2016-08-20 ENCOUNTER — Inpatient Hospital Stay (HOSPITAL_COMMUNITY)
Admission: AD | Admit: 2016-08-20 | Discharge: 2016-08-20 | Disposition: A | Discharge status: Home / Self Care | Payer: Medicaid Other | Source: Ambulatory Visit | Attending: Obstetrics and Gynecology | Admitting: Obstetrics and Gynecology

## 2016-08-20 DIAGNOSIS — O368130 Decreased fetal movements, third trimester, not applicable or unspecified: Secondary | ICD-10-CM | POA: Insufficient documentation

## 2016-08-20 DIAGNOSIS — Z3A34 34 weeks gestation of pregnancy: Secondary | ICD-10-CM | POA: Insufficient documentation

## 2016-08-20 DIAGNOSIS — O4703 False labor before 37 completed weeks of gestation, third trimester: Secondary | ICD-10-CM | POA: Diagnosis not present

## 2016-08-20 LAB — URINALYSIS, ROUTINE W REFLEX MICROSCOPIC
Bilirubin Urine: NEGATIVE
GLUCOSE, UA: 50 mg/dL — AB
HGB URINE DIPSTICK: NEGATIVE
KETONES UR: 5 mg/dL — AB
Leukocytes, UA: NEGATIVE
Nitrite: NEGATIVE
PH: 5 (ref 5.0–8.0)
PROTEIN: NEGATIVE mg/dL
Specific Gravity, Urine: 1.015 (ref 1.005–1.030)

## 2016-08-20 NOTE — Discharge Instructions (Signed)
Braxton Hicks Contractions °Contractions of the uterus can occur throughout pregnancy. Contractions are not always a sign that you are in labor.  °WHAT ARE BRAXTON HICKS CONTRACTIONS?  °Contractions that occur before labor are called Braxton Hicks contractions, or false labor. Toward the end of pregnancy (32-34 weeks), these contractions can develop more often and may become more forceful. This is not true labor because these contractions do not result in opening (dilatation) and thinning of the cervix. They are sometimes difficult to tell apart from true labor because these contractions can be forceful and people have different pain tolerances. You should not feel embarrassed if you go to the hospital with false labor. Sometimes, the only way to tell if you are in true labor is for your health care provider to look for changes in the cervix. °If there are no prenatal problems or other health problems associated with the pregnancy, it is completely safe to be sent home with false labor and await the onset of true labor. °HOW CAN YOU TELL THE DIFFERENCE BETWEEN TRUE AND FALSE LABOR? °False Labor  °· The contractions of false labor are usually shorter and not as hard as those of true labor.   °· The contractions are usually irregular.   °· The contractions are often felt in the front of the lower abdomen and in the groin.   °· The contractions may go away when you walk around or change positions while lying down.   °· The contractions get weaker and are shorter lasting as time goes on.   °· The contractions do not usually become progressively stronger, regular, and closer together as with true labor.   °True Labor  °· Contractions in true labor last 30-70 seconds, become very regular, usually become more intense, and increase in frequency.   °· The contractions do not go away with walking.   °· The discomfort is usually felt in the top of the uterus and spreads to the lower abdomen and low back.   °· True labor can be  determined by your health care provider with an exam. This will show that the cervix is dilating and getting thinner.   °WHAT TO REMEMBER °· Keep up with your usual exercises and follow other instructions given by your health care provider.   °· Take medicines as directed by your health care provider.   °· Keep your regular prenatal appointments.   °· Eat and drink lightly if you think you are going into labor.   °· If Braxton Hicks contractions are making you uncomfortable:   °¨ Change your position from lying down or resting to walking, or from walking to resting.   °¨ Sit and rest in a tub of warm water.   °¨ Drink 2-3 glasses of water. Dehydration may cause these contractions.   °¨ Do slow and deep breathing several times an hour.   °WHEN SHOULD I SEEK IMMEDIATE MEDICAL CARE? °Seek immediate medical care if: °· Your contractions become stronger, more regular, and closer together.   °· You have fluid leaking or gushing from your vagina.   °· You have a fever.   °· You pass blood-tinged mucus.   °· You have vaginal bleeding.   °· You have continuous abdominal pain.   °· You have low back pain that you never had before.   °· You feel your baby's head pushing down and causing pelvic pressure.   °· Your baby is not moving as much as it used to.   °This information is not intended to replace advice given to you by your health care provider. Make sure you discuss any questions you have with your health care   provider. °Document Released: 06/17/2005 Document Revised: 10/09/2015 Document Reviewed: 03/29/2013 °Elsevier Interactive Patient Education © 2017 [employer] ° °

## 2016-08-20 NOTE — MAU Note (Signed)
Contractions became regular "and real good this morning", about every 3-5 min. No bleeding or leaking, had a lot of pinkish tinged mucous. Baby has not been moving as much today, had reactive NST yesterday.

## 2016-08-20 NOTE — MAU Provider Note (Signed)
CC:  Chief Complaint  Patient presents with  . Contractions  . Decreased Fetal Movement     First Provider Initiated Contact with Patient 08/20/16 1657      HPI: Christine Greene is a 31 y.o. year old [phone] female at 3169w6d weeks gestation who presents to MAU reporting contractions every 5 minutes and decreased fetal mvmt  since this morning. Has been hospitalized for PTL this pregnancy and received BMZ 1/24 and 1/25. Last cervical exam 1/60.   Associated Sx:  Vaginal bleeding: Scant bloody show Leaking of fluid: Denies Fetal movement: Decreased  O: Patient Vitals for the past 24 hrs:  BP Temp Temp src Pulse Resp SpO2 Weight  08/20/16 1620 121/80 98.7 F (37.1 C) Oral 104 20 99 % 185 lb (83.9 kg)    General: NAD Heart: Regular rate Lungs: Normal rate and effort Abd: Soft, NT, Gravid, S=D Pelvic: NEFG,  No blood.  Dilation: 1 Effacement (%): Thick Cervical Position: Posterior Station: Ballotable Presentation: Vertex (by BS US) Exam by:: Ivonne AndrewV. Kyriakos Babler CNM  EFM: 145, reactive Toco: Contractions irreg, mild-mod  A: 8469w6d week IUP 1. Preterm uterine contractions in third trimester, antepartum   2. Decreased fetal movements in third trimester, single or unspecified fetus--FHR reactive  3. Threatened preterm labor, third trimester    P: Reviewed Hx, exam, NST w/ Dr. Chestine Sporelark. Discharge home in stable condition.  Preterm labor precautions and fetal kick counts. Follow-up as scheduled for prenatal visit or sooner as needed if symptoms worsen. Return to maternity admissions as needed if symptoms worsen. Increase fluids and rest.   Dorathy KinsmanVirginia Avram Danielson, CNM 06/24/2016 11:40 PM  3

## 2016-08-22 ENCOUNTER — Other Ambulatory Visit: Payer: Self-pay | Admitting: Obstetrics and Gynecology

## 2016-08-23 ENCOUNTER — Inpatient Hospital Stay (HOSPITAL_COMMUNITY)
Admission: AD | Admit: 2016-08-23 | Discharge: 2016-08-23 | Disposition: A | Discharge status: Home / Self Care | Payer: Medicaid Other | Source: Ambulatory Visit | Attending: Obstetrics and Gynecology | Admitting: Obstetrics and Gynecology

## 2016-08-23 ENCOUNTER — Inpatient Hospital Stay (HOSPITAL_COMMUNITY): Payer: Medicaid Other

## 2016-08-23 DIAGNOSIS — T1490XA Injury, unspecified, initial encounter: Secondary | ICD-10-CM | POA: Insufficient documentation

## 2016-08-23 DIAGNOSIS — M79601 Pain in right arm: Secondary | ICD-10-CM

## 2016-08-23 DIAGNOSIS — O9A213 Injury, poisoning and certain other consequences of external causes complicating pregnancy, third trimester: Secondary | ICD-10-CM | POA: Insufficient documentation

## 2016-08-23 DIAGNOSIS — Z3A35 35 weeks gestation of pregnancy: Secondary | ICD-10-CM | POA: Insufficient documentation

## 2016-08-23 DIAGNOSIS — W19XXXA Unspecified fall, initial encounter: Secondary | ICD-10-CM | POA: Insufficient documentation

## 2016-08-23 DIAGNOSIS — O09213 Supervision of pregnancy with history of pre-term labor, third trimester: Secondary | ICD-10-CM | POA: Insufficient documentation

## 2016-08-23 DIAGNOSIS — O9989 Other specified diseases and conditions complicating pregnancy, childbirth and the puerperium: Secondary | ICD-10-CM

## 2016-08-23 LAB — URINALYSIS, ROUTINE W REFLEX MICROSCOPIC
Bilirubin Urine: NEGATIVE
GLUCOSE, UA: NEGATIVE mg/dL
Hgb urine dipstick: NEGATIVE
Ketones, ur: NEGATIVE mg/dL
Leukocytes, UA: NEGATIVE
Nitrite: NEGATIVE
PH: 6 (ref 5.0–8.0)
Protein, ur: NEGATIVE mg/dL
SPECIFIC GRAVITY, URINE: 1.018 (ref 1.005–1.030)

## 2016-08-23 LAB — AMNISURE RUPTURE OF MEMBRANE (ROM) NOT AT ARMC: Amnisure ROM: NEGATIVE

## 2016-08-23 NOTE — MAU Provider Note (Signed)
History     CSN: [id number]  Arrival date and time: 08/23/16 4098   First Provider Initiated Contact with Patient 08/23/16 1651      Chief Complaint  Patient presents with  . Fall   HPI Christine Greene is a 31 y.o. [phone] at [redacted]w[redacted]d who presents s/p fall. Fall occurred around 2 pm today. States she was standing up to get out of the bathtub and lost her balance. Landed on her right upper arm against the side of the tub. Did not hit head or abdomen & did not lose consciousness. Denies abdominal pain, vaginal bleeding, or LOF Was in the bathtub due to contractions. Reports intermittent contractions throughout this week.  Upper arm pain since fall. Pain worse with movement. Rates pain 5/10. Has not treated. Pain made worse with movement & palpation (8/10).   OB History    Gravida Para Term Preterm AB Living   8 2 1 1 5 3    SAB TAB Ectopic Multiple Live Births   4 0 1 1 3       Past Medical History:  Diagnosis Date  . Preterm labor     Past Surgical History:  Procedure Laterality Date  . DILATION AND CURETTAGE OF UTERUS    . LAPAROSCOPY FOR ECTOPIC PREGNANCY    . VAGINAL DELIVERY  11/05/2011   Procedure: VAGINAL DELIVERY;  Surgeon: Levi Aland, MD;  Location: [facility];  Service: Gynecology;  Laterality: N/A;    Family History  Problem Relation Age of Onset  . Diabetes Father     Social History  Substance Use Topics  . Smoking status: Never Smoker  . Smokeless tobacco: Never Used  . Alcohol use No    Allergies: No Known Allergies  Prescriptions Prior to Admission  Medication Sig Dispense Refill Last Dose  . acetaminophen (TYLENOL) 500 MG tablet Take 1,000 mg by mouth every 6 (six) hours as needed for mild pain.   Past Week at Unknown time  . cyclobenzaprine (FLEXERIL) 5 MG tablet Take 1 tablet (5 mg total) by mouth 3 (three) times daily as needed for muscle spasms. 30 tablet 0 08/19/2016 at Unknown time  . docusate sodium (COLACE) 100 MG capsule Take 1 capsule (100 mg  total) by mouth 2 (two) times daily. 60 capsule 0 08/19/2016 at Unknown time  . escitalopram (LEXAPRO) 10 MG tablet Take 10 mg by mouth at bedtime.    08/19/2016 at Unknown time  . esomeprazole (NEXIUM) 40 MG capsule Take 40 mg by mouth at bedtime.    08/19/2016 at Unknown time  . ondansetron (ZOFRAN-ODT) 4 MG disintegrating tablet Take 4 mg by mouth every 8 (eight) hours as needed for nausea or vomiting.   Past Month at Unknown time  . Prenatal Vit-Fe Fumarate-FA (PRENATAL MULTIVITAMIN) TABS Take 1 tablet by mouth at bedtime.    08/19/2016 at Unknown time    Review of Systems  Gastrointestinal: Positive for abdominal pain (r/t contractions).  Genitourinary: Negative.   Musculoskeletal:       + right arm pain  Neurological: Negative for dizziness and syncope.   Physical Exam   Blood pressure 121/73, pulse 84, temperature 98 F (36.7 C), temperature source Oral, resp. rate 16, last menstrual period 12/11/2014, SpO2 98 %, unknown if currently breastfeeding.  Physical Exam  Nursing note and vitals reviewed. Constitutional: She is oriented to person, place, and time. She appears well-developed and well-nourished. No distress.  HENT:  Head: Normocephalic and atraumatic.  Eyes: Conjunctivae are normal. Right eye  exhibits no discharge. Left eye exhibits no discharge. No scleral icterus.  Neck: Normal range of motion.  Cardiovascular: Normal rate, regular rhythm and normal heart sounds.   No murmur heard. Respiratory: Effort normal and breath sounds normal. No respiratory distress. She has no wheezes.  GI: Soft.  Musculoskeletal: Normal range of motion.       Right upper arm: She exhibits tenderness and swelling. She exhibits no deformity and no laceration.  Neurological: She is alert and oriented to person, place, and time.  Skin: Skin is warm and dry. She is not diaphoretic.  Psychiatric: She has a normal mood and affect. Her behavior is normal. Judgment and thought content normal.    Dilation: 1 Effacement (%): Thick Station: Ballotable Exam by:: Montague Corella CNM  Fetal Tracing:  Baseline: 140 Variability: moderate Accelerations: 15x15 Decelerations: none  Toco: irr ctx, palpate mild MAU Course  Procedures Results for orders placed or performed during the hospital encounter of 08/23/16 (from the past 24 hour(s))  Urinalysis, Routine w reflex microscopic     Status: Abnormal   Collection Time: 08/23/16  4:26 PM  Result Value Ref Range   Color, Urine YELLOW YELLOW   APPearance HAZY (A) CLEAR   Specific Gravity, Urine 1.018 1.005 - 1.030   pH 6.0 5.0 - 8.0   Glucose, UA NEGATIVE NEGATIVE mg/dL   Hgb urine dipstick NEGATIVE NEGATIVE   Bilirubin Urine NEGATIVE NEGATIVE   Ketones, ur NEGATIVE NEGATIVE mg/dL   Protein, ur NEGATIVE NEGATIVE mg/dL   Nitrite NEGATIVE NEGATIVE   Leukocytes, UA NEGATIVE NEGATIVE  [facility]     Status: None   Collection Time: 08/23/16  6:07 PM  Result Value Ref Range   Amnisure ROM NEGATIVE     Dg Humerus Right  Result Date: 08/23/2016 CLINICAL DATA:  31 y/o  F; status post fall with mid humerus pain. EXAM: RIGHT HUMERUS - 2+ VIEW COMPARISON:  None. FINDINGS: There is no evidence of fracture or other focal bone lesions. Soft tissues are unremarkable. IMPRESSION: Negative. Electronically Signed   By: Mitzi HansenLance  Furusawa-Stratton M.D.   On: 08/23/2016 17:44    MDM Reactive fetal tracing Pt decline pain medication for arm SVE unchanged from previous MAU visit on 2/20 Right humerus xray -- no evidence of fracture Limited OB ultrasound -- no evidence of abruption S/w Dr. Dareen PianoAnderson. If normal ultrasound, may be discharged home.   Assessment and Plan  A; 1. Fall   2. [redacted] weeks gestation of pregnancy   3. Traumatic injury during pregnancy in third trimester    P: Discharge home Tylenol prn pain Ice/heat prn to affected area Discussed reasons to return to MAU including PTL or s/s abruption If arm  pain continues or worsens, f/u with PCP/urgent care/ED Keep f/u with OB as scheduled  Christine Greene 08/23/2016, 4:48 PM

## 2016-08-23 NOTE — Progress Notes (Addendum)
G8P2L3 @ 35.[redacted] wksga. Presents to triage for falling while getting in shower. Stated had ctx that caused the fall.. States did not fall on the belly or was hit. Fell on the right side of arm. Bruises noted.  Denies LOF or bleeding. Contracting since the fall. +FM EFM applied. VSS. See flow sheet.   GBS negative with current pregnancy  1650: Dr. Dareen PianoAnderson called to unit. Report given on pt. Ordered for U/S and d/c home if result normal. CNM made aware.   1654: provider at bs assessing pt.   1718: to Xray  1753: back from U/S  1828: Provider at bs giving discharge instructions and questions answered.   1830: discharge instructions given with pt understanding.  1845:  Pt left unit via ambulatory.

## 2016-08-23 NOTE — Discharge Instructions (Signed)
What Do I Need to Know About Injuries During Pregnancy? °Trauma is the most common cause of injury and death in pregnant women. This can also result in significant harm or death of the baby. °Your baby is protected in the womb (uterus) by a sac filled with fluid (amniotic sac). Your baby can be harmed if there is direct, high-impact trauma to your abdomen and pelvis. This type of trauma can result in tearing of your uterus, the placenta pulling away from the wall of the uterus (placenta abruption), or the amniotic sac breaking open (rupture of membranes). These injuries can decrease or stop the blood supply to your baby or cause you to go into labor earlier than expected. Minor falls and low-impact automobile accidents do not usually harm your baby, even if they do minimally harm you. °WHAT KIND OF INJURIES CAN AFFECT MY PREGNANCY? °The most common causes of injury or death to a baby include: °· Falls. Falls are more common in the second and third trimester of the pregnancy. Factors that increase your risk of falling include: °¨ Increase in your weight. °¨ The change in your center of gravity. °¨ Tripping over an object that cannot be seen. °¨ Increased looseness (laxity) of your ligaments resulting in less coordinated movements (you may feel clumsy). °¨ Falling during high-risk activities like horseback riding or skiing. °· Automobile accidents. It is important to wear your seat belt properly, with the lap belt below your abdomen, and always practice safe driving. °· Domestic violence or assault. °· Burns (fire or electrical). °The most common causes of injury or death to the pregnant woman include: °· Injuries that cause severe bleeding, shock, and loss of blood flow to major organs. °· Head and neck injuries that result in severe brain or spinal damage. °· Chest trauma that can cause direct injury to the heart and lungs or any injury that affects the area enclosed by the ribs. Trauma to this area can result in  cardiorespiratory arrest. °WHAT CAN I DO TO PROTECT MYSELF AND MY BABY FROM INJURY WHILE I AM PREGNANT? °· Remove slippery rugs and loose objects on the floor that increase your risk of tripping. °· Avoid walking on wet or slippery floors. °· Wear comfortable shoes that have a good grip on the sole. Do not wear high-heeled shoes. °· Always wear your seat belt properly, with the lap belt below your abdomen, and always practice safe driving. Do not ride on a motorcycle while pregnant. °· Do not participate in high-impact activities or sports. °· Avoid fires, starting fires, lifting heavy pots of boiling or hot liquids, and fixing electrical problems. °· Only take over-the-counter or prescription medicines for pain, fever, or discomfort as directed by your health care provider. °· Know your blood type and the father's blood type in case you develop vaginal bleeding or experience an injury for which a blood transfusion may be necessary. °· Call your local emergency services (911 in the [place] if you are a victim of domestic violence or assault. Spousal abuse can be a significant cause of trauma during pregnancy. For help and support, contact the National Domestic Violence Hotline. °WHEN SHOULD I SEEK IMMEDIATE MEDICAL CARE?  °· You fall on your abdomen or experience any high-force accident or injury. °· You have been assaulted (domestic or otherwise). °· You have been in a car accident. °· You develop vaginal bleeding. °· You develop fluid leaking from the vagina. °· You develop uterine contractions (pelvic cramping, pain, or significant low back   pain). °· You become weak or faint, or have uncontrolled vomiting after trauma. °· You had a serious burn. This includes burns to the face, neck, hands, or genitals, or burns greater than the size of your palm anywhere else. °· You develop neck stiffness or pain after a fall or from other trauma. °· You develop a headache or vision problems after a fall or from other  trauma. °· You do not feel the baby moving or the baby is not moving as much as before a fall or other trauma. °This information is not intended to replace advice given to you by your health care provider. Make sure you discuss any questions you have with your health care provider. °Document Released: 07/25/2004 Document Revised: 07/08/2014 Document Reviewed: 03/24/2013 °Elsevier Interactive Patient Education © 2017 [employer] ° °

## 2016-08-29 ENCOUNTER — Other Ambulatory Visit: Payer: Self-pay | Admitting: Obstetrics & Gynecology

## 2016-08-29 ENCOUNTER — Telehealth (HOSPITAL_COMMUNITY): Payer: Self-pay | Admitting: *Deleted

## 2016-08-29 ENCOUNTER — Encounter (HOSPITAL_COMMUNITY): Payer: Self-pay | Admitting: *Deleted

## 2016-08-30 ENCOUNTER — Inpatient Hospital Stay (HOSPITAL_COMMUNITY)
Admission: AD | Admit: 2016-08-30 | Discharge: 2016-08-30 | Disposition: A | Discharge status: Home / Self Care | Payer: Medicaid Other | Source: Ambulatory Visit | Attending: Obstetrics | Admitting: Obstetrics

## 2016-08-30 ENCOUNTER — Encounter (HOSPITAL_COMMUNITY): Payer: Self-pay

## 2016-08-30 DIAGNOSIS — O163 Unspecified maternal hypertension, third trimester: Secondary | ICD-10-CM | POA: Diagnosis not present

## 2016-08-30 DIAGNOSIS — O09213 Supervision of pregnancy with history of pre-term labor, third trimester: Secondary | ICD-10-CM | POA: Diagnosis not present

## 2016-08-30 DIAGNOSIS — Z3A36 36 weeks gestation of pregnancy: Secondary | ICD-10-CM | POA: Diagnosis not present

## 2016-08-30 LAB — COMPREHENSIVE METABOLIC PANEL
ALK PHOS: 120 U/L (ref 38–126)
ALT: 13 U/L — AB (ref 14–54)
AST: 18 U/L (ref 15–41)
Albumin: 3.2 g/dL — ABNORMAL LOW (ref 3.5–5.0)
Anion gap: 10 (ref 5–15)
BILIRUBIN TOTAL: 0.1 mg/dL — AB (ref 0.3–1.2)
BUN: 7 mg/dL (ref 6–20)
CO2: 24 mmol/L (ref 22–32)
CREATININE: 0.43 mg/dL — AB (ref 0.44–1.00)
Calcium: 8.9 mg/dL (ref 8.9–10.3)
Chloride: 101 mmol/L (ref 101–111)
GFR calc Af Amer: >60 mL/min (ref >60)
Glucose, Bld: 80 mg/dL (ref 65–99)
Potassium: 3.5 mmol/L (ref 3.5–5.1)
Sodium: 135 mmol/L (ref 135–145)
TOTAL PROTEIN: 6.9 g/dL (ref 6.5–8.1)

## 2016-08-30 LAB — CBC
HCT: 36.4 % (ref 36.0–46.0)
Hemoglobin: 12.3 g/dL (ref 12.0–15.0)
MCH: 29.8 pg (ref 26.0–34.0)
MCHC: 33.8 g/dL (ref 30.0–36.0)
MCV: 88.1 fL (ref 78.0–100.0)
Platelets: 164 10*3/uL (ref 150–400)
RBC: 4.13 MIL/uL (ref 3.87–5.11)
RDW: 14.3 % (ref 11.5–15.5)
WBC: 8.5 10*3/uL (ref 4.0–10.5)

## 2016-08-30 LAB — URINALYSIS, ROUTINE W REFLEX MICROSCOPIC
Bilirubin Urine: NEGATIVE
Glucose, UA: NEGATIVE mg/dL
Hgb urine dipstick: NEGATIVE
Ketones, ur: 5 mg/dL — AB
Leukocytes, UA: NEGATIVE
Nitrite: NEGATIVE
Protein, ur: NEGATIVE mg/dL
Specific Gravity, Urine: 1.013 (ref 1.005–1.030)
pH: 6 (ref 5.0–8.0)

## 2016-08-30 LAB — AMNISURE RUPTURE OF MEMBRANE (ROM) NOT AT ARMC: Amnisure ROM: NEGATIVE

## 2016-08-30 LAB — PROTEIN / CREATININE RATIO, URINE
Creatinine, Urine: 68 mg/dL
Protein Creatinine Ratio: 0.16 mg/mg{creat} — ABNORMAL HIGH (ref 0.00–0.15)
Total Protein, Urine: 11 mg/dL

## 2016-08-30 NOTE — MAU Note (Signed)
Pt sent from office for Pre-E evaluation

## 2016-08-30 NOTE — Discharge Instructions (Signed)
Hypertension During Pregnancy Hypertension is also called high blood pressure. High blood pressure means that the force of your blood moving in your body is too strong. When you are pregnant, this condition should be watched carefully. It can cause problems for you and your baby. Follow these instructions at home: Eating and drinking  Drink enough fluid to keep your pee (urine) clear or pale yellow.  Eat healthy foods that are low in salt (sodium). ? Do not add salt to your food. ? Check labels on foods and drinks to see much salt is in them. Look on the label where you see "Sodium." Lifestyle  Do not use any products that contain nicotine or tobacco, such as cigarettes and e-cigarettes. If you need help quitting, ask your doctor.  Do not use alcohol.  Avoid caffeine.  Avoid stress. Rest and get plenty of sleep. General instructions  Take over-the-counter and prescription medicines only as told by your doctor.  While lying down, lie on your left side. This keeps pressure off your baby.  While sitting or lying down, raise (elevate) your feet. Try putting some pillows under your lower legs.  Exercise regularly. Ask your doctor what kinds of exercise are best for you.  Keep all prenatal and follow-up visits as told by your doctor. This is important. Contact a doctor if:  You have symptoms that your doctor told you to watch for, such as: ? Fever. ? Throwing up (vomiting). ? Headache. Get help right away if:  You have very bad pain in your belly (abdomen).  You are throwing up, and this does not get better with treatment.  You suddenly get swelling in your hands, ankles, or face.  You gain 4 lb (1.8 kg) or more in 1 week.  You get bleeding from your vagina.  You have blood in your pee.  You do not feel your baby moving as much as normal.  You have a change in vision.  You have muscle twitching or sudden tightening (spasms).  You have trouble breathing.  Your lips  or fingernails turn blue. This information is not intended to replace advice given to you by your health care provider. Make sure you discuss any questions you have with your health care provider. Document Released: 07/20/2010 Document Revised: 02/27/2016 Document Reviewed: 02/27/2016 Elsevier Interactive Patient Education  2017 [employer]  

## 2016-08-30 NOTE — MAU Provider Note (Signed)
Chief Complaint:  Hypertension   First Provider Initiated Contact with Patient 08/30/16 1322     HPI: Christine Greene is a 31 y.o. [id number] at 46w2dho presents to maternity admissions reporting hypertension in office.  Also has felt wetness today, not sure if leaking. States contractions are painful and feels a lot of pressure.. She reports good fetal movement, denies vaginal bleeding, vaginal itching/burning, urinary symptoms, h/a, dizziness, n/v, diarrhea, constipation or fever/chills.  She denies headache, visual changes or RUQ abdominal pain.  Hypertension  This is a new problem. The current episode started today. The problem is unchanged. Associated symptoms include peripheral edema. Pertinent negatives include no anxiety, blurred vision, chest pain, headaches or shortness of breath. There are no associated agents to hypertension. There are no known risk factors for coronary artery disease. Past treatments include nothing. There are no compliance problems.    RN note: Pt sent from office for Pre-E evaluation  Past Medical History: Past Medical History:  Diagnosis Date  . Preterm labor     Past obstetric history: OB History  Gravida Para Term Preterm AB Living  8 2 1 1 5 3   SAB TAB Ectopic Multiple Live Births  4 0 1 1 3     # Outcome Date GA Lbr Len/2nd Weight Sex Delivery Anes PTL Lv  8 Current           7A Preterm 11/05/11 311w4d1:40 / 00:07 5 lb 1.5 oz (2.31 kg) M Vag-Spont EPI, Local  LIV  7B Preterm 11/05/11 3325w4d:40 / 00:09 4 lb 3.4 oz (1.91 kg) F Vag-Spont Local, EPI  LIV  6 Term 07/08/06    F Vag-Spont   LIV  5 SAB           4 SAB           3 SAB           2 SAB           1 Ectopic               Past Surgical History: Past Surgical History:  Procedure Laterality Date  . DILATION AND CURETTAGE OF UTERUS    . LAPAROSCOPY FOR ECTOPIC PREGNANCY    . VAGINAL DELIVERY  11/05/2011   Procedure: VAGINAL DELIVERY;  Surgeon: MarOlga MillersD;  [facility];   Service: Gynecology;  Laterality: N/A;    Family History: Family History  Problem Relation Age of Onset  . Diabetes Father     Social History: Social History  Substance Use Topics  . Smoking status: Never Smoker  . Smokeless tobacco: Never Used  . Alcohol use No    Allergies: No Known Allergies  Meds:  Prescriptions Prior to Admission  Medication Sig Dispense Refill Last Dose  . acetaminophen (TYLENOL) 500 MG tablet Take 1,000 mg by mouth every 6 (six) hours as needed for mild pain.   08/29/2016 at Unknown time  . cyclobenzaprine (FLEXERIL) 5 MG tablet Take 1 tablet (5 mg total) by mouth 3 (three) times daily as needed for muscle spasms. 30 tablet 0 Past Week at Unknown time  . docusate sodium (COLACE) 100 MG capsule Take 1 capsule (100 mg total) by mouth 2 (two) times daily. 60 capsule 0 08/29/2016 at Unknown time  . escitalopram (LEXAPRO) 10 MG tablet Take 10 mg by mouth at bedtime.    08/29/2016 at Unknown time  . esomeprazole (NEXIUM) 40 MG capsule Take 40 mg by mouth at bedtime.  08/29/2016 at Unknown time  . ondansetron (ZOFRAN-ODT) 4 MG disintegrating tablet Take 4 mg by mouth every 8 (eight) hours as needed for nausea or vomiting.   08/29/2016 at Unknown time  . Prenatal Vit-Fe Fumarate-FA (PRENATAL MULTIVITAMIN) TABS Take 1 tablet by mouth at bedtime.    08/29/2016 at Unknown time    I have reviewed patient's Past Medical Hx, Surgical Hx, Family Hx, Social Hx, medications and allergies.   ROS:  Review of Systems  Constitutional: Negative for chills and fever.  Eyes: Negative for blurred vision and visual disturbance.  Respiratory: Negative for shortness of breath.   Cardiovascular: Negative for chest pain.  Gastrointestinal: Positive for abdominal pain. Negative for constipation, diarrhea, nausea and vomiting.  Neurological: Negative for syncope, speech difficulty, weakness and headaches.   Other systems negative  Physical Exam  Patient Vitals for the past 24 hrs:  BP  Temp Temp src Pulse Resp  08/30/16 1346 127/83 - - 102 -  08/30/16 1339 123/81 - - 90 -  08/30/16 1316 124/84 - - 85 -  08/30/16 1301 123/86 - - 88 -  08/30/16 1246 119/62 - - 92 -  08/30/16 1234 121/80 - - 86 -  08/30/16 1227 133/89 98.4 F (36.9 C) Oral 75 18   Constitutional: Well-developed, well-nourished female in no acute distress.  Cardiovascular: normal rate and rhythm Respiratory: normal effort, clear to auscultation bilaterally GI: Abd soft, non-tender, gravid appropriate for gestational age.   No rebound or guarding. MS: Extremities nontender, Trace edema, normal ROM Neurologic: Alert and oriented x 4.  DTRs normal with no clonus GU: Neg CVAT.   Dilation: 1 Effacement (%): 60 Station: [place], -3 Exam by:: Carmelia Roller CNM  FHT:  Baseline 135 , moderate variability, accelerations present, no decelerations Contractions:  Irregular     Labs: --/--/B NEG (02/14 2200)   Ref. Range 08/30/2016 13:34  Sodium Latest Ref Range: 135 - 145 mmol/L 135  Potassium Latest Ref Range: 3.5 - 5.1 mmol/L 3.5  Chloride Latest Ref Range: 101 - 111 mmol/L 101  CO2 Latest Ref Range: 22 - 32 mmol/L 24  Glucose Latest Ref Range: 65 - 99 mg/dL 80  BUN Latest Ref Range: 6 - 20 mg/dL 7  Creatinine Latest Ref Range: 0.44 - 1.00 mg/dL 0.43 (L)  Calcium Latest Ref Range: 8.9 - 10.3 mg/dL 8.9  Anion gap Latest Ref Range: 5 - 15  10  Alkaline Phosphatase Latest Ref Range: 38 - 126 U/L 120  Albumin Latest Ref Range: 3.5 - 5.0 g/dL 3.2 (L)  AST Latest Ref Range: 15 - 41 U/L 18  ALT Latest Ref Range: 14 - 54 U/L 13 (L)  Total Protein Latest Ref Range: 6.5 - 8.1 g/dL 6.9  Total Bilirubin Latest Ref Range: 0.3 - 1.2 mg/dL 0.1 (L)  EGFR (African American) Latest Ref Range: >60 mL/min >60  EGFR (Non-African Amer.) Latest Ref Range: >60 mL/min >60  WBC Latest Ref Range: 4.0 - 10.5 K/uL 8.5  RBC Latest Ref Range: 3.87 - 5.11 MIL/uL 4.13  Hemoglobin Latest Ref Range: 12.0 - 15.0 g/dL 12.3  HCT  Latest Ref Range: 36.0 - 46.0 % 36.4  MCV Latest Ref Range: 78.0 - 100.0 fL 88.1  MCH Latest Ref Range: 26.0 - 34.0 pg 29.8  MCHC Latest Ref Range: 30.0 - 36.0 g/dL 33.8  RDW Latest Ref Range: 11.5 - 15.5 % 14.3  Platelets Latest Ref Range: 150 - 400 K/uL 164    Ref. Range 08/30/2016 13:22  Total  Protein, Urine Latest Units: mg/dL 11  Protein Creatinine Ratio Latest Ref Range: 0.00 - 0.15 mg/mgCre 0.16 (H)  Creatinine, Urine Latest Units: mg/dL 68.00    Imaging:  Dg Humerus Right  Result Date: 08/23/2016 CLINICAL DATA:  31 y/o  F; status post fall with mid humerus pain. EXAM: RIGHT HUMERUS - 2+ VIEW COMPARISON:  None. FINDINGS: There is no evidence of fracture or other focal bone lesions. Soft tissues are unremarkable. IMPRESSION: Negative. Electronically Signed   By: Kristine Garbe M.D.   On: 08/23/2016 17:44   [place] Mfm Ob Limited  Result Date: 08/25/2016 ----------------------------------------------------------------------  OBSTETRICS REPORT                      (Signed Final 08/25/2016 12:33 pm) ---------------------------------------------------------------------- Patient Info  ID #:       [id number]                          D.O.B.:  June 12, 1986 (30 yrs)  Name:       Christine Greene               Visit Date: 08/23/2016 05:57 pm ---------------------------------------------------------------------- Performed By  Performed By:     Rodrigo Ran BS      Ref. Address:     [place]                    [place]  Attending:        Seward Meth MD         Secondary Phy.:   MAU Nursing-                                                             MAU/Triage  Referred By:      Jorje Guild          Location:         [facility]                    CNM ---------------------------------------------------------------------- Orders   #  Description                                 Code   1  [place] MFM OB LIMITED                            [phone]  ----------------------------------------------------------------------   #  Ordered By               Order #        Accession #    Episode #   1  Jorje Guild            [id number]      [id number]     [id number]  ---------------------------------------------------------------------- Indications  [redacted] weeks gestation of pregnancy                Z3A.35   Traumatic injury during pregnancy              O9A.219 T14.90  ---------------------------------------------------------------------- OB History  Gravidity:    8         Term:   2        Prem:   1        SAB:   4  Ectopic:      1        Living:  3 ---------------------------------------------------------------------- Fetal Evaluation  Num Of Fetuses:     1  Fetal Heart         132  Rate(bpm):  Cardiac Activity:   Observed  Presentation:       Cephalic  Placenta:           Posterior, above cervical os  P. Cord Insertion:  Not well visualized  Amniotic Fluid  AFI FV:      Subjectively within normal limits  AFI Sum(cm)     %Tile       Largest Pocket(cm)  13.08           44          5.97  RUQ(cm)       RLQ(cm)       LUQ(cm)        LLQ(cm)  4.77          5.97          0              2.34  Comment:    No placental abruption or previa identified. ---------------------------------------------------------------------- Gestational Age  Clinical EDD:  35w 2d                                        EDD:   09/25/16  Best:          35w 2d     Det. By:  Clinical EDD             EDD:   09/25/16 ---------------------------------------------------------------------- Cervix Uterus Adnexa  Cervix  Not visualized (advanced GA >29wks)  Uterus  No abnormality visualized.  Left Ovary  Within normal limits.  Right Ovary  Within normal limits.  Cul De Sac:   No free fluid seen.  Adnexa:       No abnormality visualized. ---------------------------------------------------------------------- Impression  Singleton intrauterine pregnancy at 35 weeks 2 days  gesetation with fetal  cardiac activity  Cephalic presentation  Posterior placenta without evidence of previa  AFI > 13 cm  No sonographic evidence of intrauterine bleeding ---------------------------------------------------------------------- Recommendations  Follow-up ultrasounds as clinically indicated. ----------------------------------------------------------------------                   Seward Meth, MD Electronically Signed Final Report   08/25/2016 12:33 pm ----------------------------------------------------------------------   MAU Course/MDM: I have ordered labs and reviewed results.  PIH labs and amnisure sent Amnisure negative. NST reviewed Consult Dr Carlis Abbott with presentation, exam findings and test results.  .    Assessment: SIUP at 93w2dHypertension affecting pregnancy in third trimester - Plan: Discharge patient   Plan: Discharge home Preeclampsia precautions Labor precautions and fetal kick counts Follow up in Office for prenatal visits and recheck of BP early week  Encouraged to return here or to  other Urgent Care/ED if she develops worsening of symptoms, increase in pain, fever, or other concerning symptoms.   Pt stable at time of discharge.  Hansel Feinstein CNM, MSN Certified Nurse-Midwife 08/30/2016 2:04 PM

## 2016-08-30 NOTE — Progress Notes (Signed)
Signature pad not working. Discharge papers reviewed and signed copy with medical records

## 2016-09-02 ENCOUNTER — Encounter (HOSPITAL_COMMUNITY): Payer: Self-pay | Admitting: *Deleted

## 2016-09-02 NOTE — Telephone Encounter (Signed)
Preadmission screen  

## 2016-09-05 ENCOUNTER — Encounter (HOSPITAL_COMMUNITY): Payer: Self-pay

## 2016-09-05 ENCOUNTER — Inpatient Hospital Stay (HOSPITAL_COMMUNITY): Payer: Medicaid Other | Admitting: Anesthesiology

## 2016-09-05 ENCOUNTER — Encounter (HOSPITAL_COMMUNITY): Payer: Self-pay | Admitting: Anesthesiology

## 2016-09-05 ENCOUNTER — Inpatient Hospital Stay (HOSPITAL_COMMUNITY)
Admission: RE | Admit: 2016-09-05 | Discharge: 2016-09-07 | DRG: 775 | Disposition: A | Discharge status: Home / Self Care | Payer: Medicaid Other | Source: Ambulatory Visit | Attending: Obstetrics and Gynecology | Admitting: Obstetrics and Gynecology

## 2016-09-05 DIAGNOSIS — Z3A37 37 weeks gestation of pregnancy: Secondary | ICD-10-CM | POA: Diagnosis not present

## 2016-09-05 DIAGNOSIS — Z833 Family history of diabetes mellitus: Secondary | ICD-10-CM

## 2016-09-05 DIAGNOSIS — O134 Gestational [pregnancy-induced] hypertension without significant proteinuria, complicating childbirth: Secondary | ICD-10-CM | POA: Diagnosis present

## 2016-09-05 DIAGNOSIS — O139 Gestational [pregnancy-induced] hypertension without significant proteinuria, unspecified trimester: Secondary | ICD-10-CM | POA: Diagnosis present

## 2016-09-05 HISTORY — DX: Gestational (pregnancy-induced) hypertension without significant proteinuria, unspecified trimester: O13.9

## 2016-09-05 LAB — CBC
HCT: 31.1 % — ABNORMAL LOW (ref 36.0–46.0)
HEMATOCRIT: 32.8 % — AB (ref 36.0–46.0)
Hemoglobin: 11.2 g/dL — ABNORMAL LOW (ref 12.0–15.0)
Hemoglobin: 10.5 g/dL — ABNORMAL LOW (ref 12.0–15.0)
MCH: 29.9 pg (ref 26.0–34.0)
MCH: 29.8 pg (ref 26.0–34.0)
MCHC: 34.1 g/dL (ref 30.0–36.0)
MCHC: 33.8 g/dL (ref 30.0–36.0)
MCV: 87.7 fL (ref 78.0–100.0)
MCV: 88.4 fL (ref 78.0–100.0)
PLATELETS: 153 10*3/uL (ref 150–400)
Platelets: 142 10*3/uL — ABNORMAL LOW (ref 150–400)
RBC: 3.74 MIL/uL — AB (ref 3.87–5.11)
RBC: 3.52 MIL/uL — AB (ref 3.87–5.11)
RDW: 14.6 % (ref 11.5–15.5)
RDW: 14.6 % (ref 11.5–15.5)
WBC: 10.3 10*3/uL (ref 4.0–10.5)
WBC: 13.2 10*3/uL — AB (ref 4.0–10.5)

## 2016-09-05 LAB — TYPE AND SCREEN
ABO/RH(D): B NEG
Antibody Screen: NEGATIVE

## 2016-09-05 MED ORDER — OXYTOCIN 40 UNITS IN LACTATED RINGERS INFUSION - SIMPLE MED
1.0000 m[IU]/min | INTRAVENOUS | Status: DC
  Administered 2016-09-05: 2 m[IU]/min via INTRAVENOUS
  Filled 2016-09-05: qty 1000

## 2016-09-05 MED ORDER — LACTATED RINGERS IV SOLN
500.0000 mL | Freq: Once | INTRAVENOUS | Status: AC
  Administered 2016-09-05: 500 mL via INTRAVENOUS

## 2016-09-05 MED ORDER — ONDANSETRON HCL 4 MG/2ML IJ SOLN
4.0000 mg | Freq: Four times a day (QID) | INTRAMUSCULAR | Status: DC | PRN
  Administered 2016-09-05: 4 mg via INTRAVENOUS
  Filled 2016-09-05: qty 2

## 2016-09-05 MED ORDER — COCONUT OIL OIL: 1.0000 "application " | TOPICAL_OIL | Status: DC | PRN

## 2016-09-05 MED ORDER — SOD CITRATE-CITRIC ACID 500-334 MG/5ML PO SOLN: 30.0000 mL | ORAL | Status: DC | PRN

## 2016-09-05 MED ORDER — ZOLPIDEM TARTRATE 5 MG PO TABS: 5.0000 mg | ORAL_TABLET | Freq: Every evening | ORAL | Status: DC | PRN

## 2016-09-05 MED ORDER — ESCITALOPRAM OXALATE 10 MG PO TABS
10.0000 mg | ORAL_TABLET | Freq: Every day | ORAL | Status: DC
  Administered 2016-09-05 – 2016-09-06 (×2): 10 mg via ORAL
  Filled 2016-09-05 (×3): qty 1

## 2016-09-05 MED ORDER — DIPHENHYDRAMINE HCL 25 MG PO CAPS: 25.0000 mg | ORAL_CAPSULE | Freq: Four times a day (QID) | ORAL | Status: DC | PRN

## 2016-09-05 MED ORDER — TERBUTALINE SULFATE 1 MG/ML IJ SOLN
0.2500 mg | Freq: Once | INTRAMUSCULAR | Status: DC | PRN
  Filled 2016-09-05: qty 1

## 2016-09-05 MED ORDER — EPHEDRINE 5 MG/ML INJ
10.0000 mg | INTRAVENOUS | Status: DC | PRN
  Filled 2016-09-05: qty 4

## 2016-09-05 MED ORDER — DIPHENHYDRAMINE HCL 50 MG/ML IJ SOLN
12.5000 mg | INTRAMUSCULAR | Status: DC | PRN
  Administered 2016-09-05: 12.5 mg via INTRAVENOUS
  Filled 2016-09-05: qty 1

## 2016-09-05 MED ORDER — LACTATED RINGERS IV SOLN
500.0000 mL | INTRAVENOUS | Status: DC | PRN
  Administered 2016-09-05: 500 mL via INTRAVENOUS

## 2016-09-05 MED ORDER — OXYCODONE-ACETAMINOPHEN 5-325 MG PO TABS: 2.0000 | ORAL_TABLET | ORAL | Status: DC | PRN

## 2016-09-05 MED ORDER — OXYTOCIN BOLUS FROM INFUSION
500.0000 mL | Freq: Once | INTRAVENOUS | Status: AC
  Administered 2016-09-05: 500 mL via INTRAVENOUS

## 2016-09-05 MED ORDER — BENZOCAINE-MENTHOL 20-0.5 % EX AERO
1.0000 "application " | INHALATION_SPRAY | CUTANEOUS | Status: DC | PRN
  Administered 2016-09-05: 1 via TOPICAL
  Filled 2016-09-05: qty 56

## 2016-09-05 MED ORDER — WITCH HAZEL-GLYCERIN EX PADS: 1.0000 "application " | MEDICATED_PAD | CUTANEOUS | Status: DC | PRN

## 2016-09-05 MED ORDER — DIBUCAINE 1 % RE OINT: 1.0000 "application " | TOPICAL_OINTMENT | RECTAL | Status: DC | PRN

## 2016-09-05 MED ORDER — FENTANYL 2.5 MCG/ML BUPIVACAINE 1/10 % EPIDURAL INFUSION (WH - ANES)
14.0000 mL/h | INTRAMUSCULAR | Status: DC | PRN
  Administered 2016-09-05: 14 mL/h via EPIDURAL

## 2016-09-05 MED ORDER — IBUPROFEN 600 MG PO TABS
600.0000 mg | ORAL_TABLET | Freq: Four times a day (QID) | ORAL | Status: DC
  Administered 2016-09-05 – 2016-09-07 (×8): 600 mg via ORAL
  Filled 2016-09-05 (×8): qty 1

## 2016-09-05 MED ORDER — OXYCODONE-ACETAMINOPHEN 5-325 MG PO TABS: 1.0000 | ORAL_TABLET | ORAL | Status: DC | PRN

## 2016-09-05 MED ORDER — MISOPROSTOL 200 MCG PO TABS
ORAL_TABLET | ORAL | Status: AC
  Filled 2016-09-05: qty 5

## 2016-09-05 MED ORDER — MISOPROSTOL 200 MCG PO TABS
1000.0000 ug | ORAL_TABLET | Freq: Once | ORAL | Status: AC
  Administered 2016-09-05: 1000 ug via RECTAL

## 2016-09-05 MED ORDER — PHENYLEPHRINE 40 MCG/ML (10ML) SYRINGE FOR IV PUSH (FOR BLOOD PRESSURE SUPPORT)
80.0000 ug | PREFILLED_SYRINGE | INTRAVENOUS | Status: DC | PRN
  Filled 2016-09-05: qty 5

## 2016-09-05 MED ORDER — FENTANYL 2.5 MCG/ML BUPIVACAINE 1/10 % EPIDURAL INFUSION (WH - ANES)
INTRAMUSCULAR | Status: AC
  Filled 2016-09-05: qty 100

## 2016-09-05 MED ORDER — LACTATED RINGERS IV SOLN
INTRAVENOUS | Status: DC
  Administered 2016-09-05: 02:00:00 via INTRAVENOUS

## 2016-09-05 MED ORDER — PHENYLEPHRINE 40 MCG/ML (10ML) SYRINGE FOR IV PUSH (FOR BLOOD PRESSURE SUPPORT)
80.0000 ug | PREFILLED_SYRINGE | INTRAVENOUS | Status: DC | PRN
  Administered 2016-09-05 (×2): 80 ug via INTRAVENOUS
  Filled 2016-09-05: qty 5

## 2016-09-05 MED ORDER — TETANUS-DIPHTH-ACELL PERTUSSIS 5-2.5-18.5 LF-MCG/0.5 IM SUSP: 0.5000 mL | Freq: Once | INTRAMUSCULAR | Status: DC

## 2016-09-05 MED ORDER — SIMETHICONE 80 MG PO CHEW: 80.0000 mg | CHEWABLE_TABLET | ORAL | Status: DC | PRN

## 2016-09-05 MED ORDER — LIDOCAINE HCL (PF) 1 % IJ SOLN
INTRAMUSCULAR | Status: DC | PRN
  Administered 2016-09-05 (×2): 5 mL

## 2016-09-05 MED ORDER — ACETAMINOPHEN 325 MG PO TABS
650.0000 mg | ORAL_TABLET | ORAL | Status: DC | PRN
  Administered 2016-09-05 – 2016-09-07 (×10): 650 mg via ORAL
  Filled 2016-09-05 (×10): qty 2

## 2016-09-05 MED ORDER — SENNOSIDES-DOCUSATE SODIUM 8.6-50 MG PO TABS
2.0000 | ORAL_TABLET | ORAL | Status: DC
  Administered 2016-09-05 – 2016-09-07 (×2): 2 via ORAL
  Filled 2016-09-05 (×3): qty 2

## 2016-09-05 MED ORDER — PHENYLEPHRINE 40 MCG/ML (10ML) SYRINGE FOR IV PUSH (FOR BLOOD PRESSURE SUPPORT)
PREFILLED_SYRINGE | INTRAVENOUS | Status: AC
  Filled 2016-09-05: qty 20

## 2016-09-05 MED ORDER — LIDOCAINE HCL (PF) 1 % IJ SOLN
30.0000 mL | INTRAMUSCULAR | Status: AC | PRN
  Administered 2016-09-05: 30 mL via SUBCUTANEOUS
  Filled 2016-09-05: qty 30

## 2016-09-05 MED ORDER — ONDANSETRON HCL 4 MG PO TABS
4.0000 mg | ORAL_TABLET | ORAL | Status: DC | PRN
  Administered 2016-09-05: 4 mg via ORAL
  Filled 2016-09-05: qty 1

## 2016-09-05 MED ORDER — ONDANSETRON HCL 4 MG/2ML IJ SOLN: 4.0000 mg | INTRAMUSCULAR | Status: DC | PRN

## 2016-09-05 MED ORDER — PROMETHAZINE HCL 25 MG/ML IJ SOLN
12.5000 mg | Freq: Four times a day (QID) | INTRAMUSCULAR | Status: DC | PRN
  Filled 2016-09-05: qty 1

## 2016-09-05 MED ORDER — PRENATAL MULTIVITAMIN CH
1.0000 | ORAL_TABLET | Freq: Every day | ORAL | Status: DC
  Administered 2016-09-07: 1 via ORAL
  Filled 2016-09-05 (×2): qty 1

## 2016-09-05 NOTE — Lactation Note (Signed)
This note was copied from a baby's chart. Lactation Consultation Note  Patient Name: Christine Orion ModestChristina Greene ZOXWR'UToday's Date: 09/05/2016 Reason for consult: Initial assessment   Initial assessment with Exp BF mom of < 1 hour old infant [facility]. Mom reports she BF her 31 yo for 16 months. She pumped for her 34 yo preterm twins until  a month after they came home.  Mom with compressible breasts and areola and small everted nipples. Infant was latched to the right breast in the cradle hold when I entered the room. Infant was actively feeding. Mom asked if latch was ok, discussed deep latch vs shallow latch and that infant in good position with some swallows noted. Mom did notice once when infant was shallowly latched and relatched infant with improved comfort. Enc mom to BF STS 8-12 x in 24 hours at first feeding cues using pillow and head support. Mom showed she knew how to hand express and reports + breast changes with pregnancy. Enc mom to massage/compress breast with feeding.   BF basics, cluster feeding, positioning, pillow and head support. Colostrum, milk coming to volume, infant stomach size and hand expression reviewed. Mom without further questions/concerns at this time. Mom has a Medela PIS at home. BF Resources Handout and LC Brochure given, mom informed of IP/OP Services, BF Support Groups and LC phone #. Enc mom to call out to desk for feeding assistance as needed.     Maternal Data Formula Feeding for Exclusion: No Has patient been taught Hand Expression?: Yes Does the patient have breastfeeding experience prior to this delivery?: Yes  Feeding Feeding Type: Breast Fed Length of feed: 15 min  LATCH Score/Interventions Latch: Grasps breast easily, tongue down, lips flanged, rhythmical sucking.  Audible Swallowing: Spontaneous and intermittent  Type of Nipple: Everted at rest and after stimulation  Comfort (Breast/Nipple): Soft / non-tender     Hold (Positioning): No  assistance needed to correctly position infant at breast.  LATCH Score: 10  Lactation Tools Discussed/Used WIC Program: No   Consult Status Consult Status: Follow-up Date: 09/06/16 Follow-up type: In-patient    Silas FloodSharon S Mirage Pfefferkorn 09/05/2016, 12:27 PM

## 2016-09-05 NOTE — Anesthesia Preprocedure Evaluation (Signed)
Anesthesia Evaluation  Patient identified by MRN, date of birth, ID band Patient awake    Reviewed: Allergy & Precautions, H&P , NPO status , Patient's Chart, lab work & pertinent test results  History of Anesthesia Complications Negative for: history of anesthetic complications  Airway Mallampati: II  TM Distance: >3 FB Neck ROM: full    Dental no notable dental hx. (+) Teeth Intact   Pulmonary neg pulmonary ROS,    Pulmonary exam normal breath sounds clear to auscultation       Cardiovascular hypertension, negative cardio ROS Normal cardiovascular exam Rhythm:regular Rate:Normal     Neuro/Psych negative neurological ROS  negative psych ROS   GI/Hepatic negative GI ROS, Neg liver ROS,   Endo/Other  negative endocrine ROS  Renal/GU negative Renal ROS  negative genitourinary   Musculoskeletal   Abdominal   Peds  Hematology negative hematology ROS (+)   Anesthesia Other Findings   Reproductive/Obstetrics (+) Pregnancy Pre eclampsia                             Anesthesia Physical Anesthesia Plan  ASA: II  Anesthesia Plan: Epidural   Post-op Pain Management:    Induction:   Airway Management Planned:   Additional Equipment:   Intra-op Plan:   Post-operative Plan:   Informed Consent: I have reviewed the patients History and Physical, chart, labs and discussed the procedure including the risks, benefits and alternatives for the proposed anesthesia with the patient or authorized representative who has indicated his/her understanding and acceptance.     Plan Discussed with:   Anesthesia Plan Comments:         Anesthesia Quick Evaluation

## 2016-09-05 NOTE — Anesthesia Procedure Notes (Signed)
Epidural Patient location during procedure: OB  Staffing Anesthesiologist: Mordecai Tindol Performed: anesthesiologist   Preanesthetic Checklist Completed: patient identified, site marked, surgical consent, pre-op evaluation, timeout performed, IV checked, risks and benefits discussed and monitors and equipment checked  Epidural Patient position: sitting Prep: DuraPrep Patient monitoring: heart rate, continuous pulse ox and blood pressure Approach: right paramedian Location: L3-L4 Injection technique: LOR saline  Needle:  Needle type: Tuohy  Needle gauge: 17 G Needle length: 9 cm and 9 Needle insertion depth: 6 cm Catheter type: closed end flexible Catheter size: 20 Guage Catheter at skin depth: 9 cm Test dose: negative  Assessment Events: blood not aspirated, injection not painful, no injection resistance, negative IV test and no paresthesia  Additional Notes Patient identified. Risks/Benefits/Options discussed with patient including but not limited to bleeding, infection, nerve damage, paralysis, failed block, incomplete pain control, headache, blood pressure changes, nausea, vomiting, reactions to medication both or allergic, itching and postpartum back pain. Confirmed with bedside nurse the patient's most recent platelet count. Confirmed with patient that they are not currently taking any anticoagulation, have any bleeding history or any family history of bleeding disorders. Patient expressed understanding and wished to proceed. All questions were answered. Sterile technique was used throughout the entire procedure. Please see nursing notes for vital signs. Test dose was given through epidural needle and negative prior to continuing to dose epidural or start infusion. Warning signs of high block given to the patient including shortness of breath, tingling/numbness in hands, complete motor block, or any concerning symptoms with instructions to call for help. Patient was given  instructions on fall risk and not to get out of bed. All questions and concerns addressed with instructions to call with any issues.     

## 2016-09-05 NOTE — Anesthesia Pain Management Evaluation Note (Signed)
  CRNA Pain Management Visit Note  Patient: Christine Greene, 31 y.o., female  "Hello [facility] am a [facility]. We have an anesthesia team available at all times to provide care throughout the hospital, including epidural management and anesthesia for C-section. I don't know your plan for the delivery whether it a natural birth, water birth, IV sedation, nitrous supplementation, doula or epidural, but we want to meet your pain goals."   1.Was your pain managed to your expectations on prior hospitalizations?   Yes   2.What is your expectation for pain management during this hospitalization?     Epidural  3.How can we help you reach that goal? epidural  Record the patient's initial score and the patient's pain goal.   Pain: 0  Pain Goal: 5 [facility] wants you to be able to say your pain was always managed very well.  Maizy Davanzo 09/05/2016

## 2016-09-05 NOTE — Anesthesia Postprocedure Evaluation (Signed)
Anesthesia Post Note  Patient: Christine Greene  Procedure(s) Performed: * No procedures listed *  Patient location during evaluation: Mother Baby Anesthesia Type: Epidural Level of consciousness: awake, awake and alert and oriented Pain management: pain level controlled Vital Signs Assessment: post-procedure vital signs reviewed and stable Respiratory status: spontaneous breathing, nonlabored ventilation and respiratory function stable Cardiovascular status: stable Postop Assessment: no headache, no backache, patient able to bend at knees, no signs of nausea or vomiting and adequate PO intake Anesthetic complications: no        Last Vitals:  Vitals:   09/05/16 1310 09/05/16 1408  BP: 137/65 (!) 135/56  Pulse: 82 84  Resp: 18 18  Temp: 37.7 C 37.5 C    Last Pain:  Vitals:   09/05/16 1427  TempSrc:   PainSc: 1    Pain Goal:                 [name]

## 2016-09-06 ENCOUNTER — Inpatient Hospital Stay (HOSPITAL_COMMUNITY): Admission: RE | Admit: 2016-09-06 | Payer: Medicaid Other | Source: Ambulatory Visit

## 2016-09-06 LAB — CBC
HCT: 28.1 % — ABNORMAL LOW (ref 36.0–46.0)
Hemoglobin: 9.4 g/dL — ABNORMAL LOW (ref 12.0–15.0)
MCH: 29.7 pg (ref 26.0–34.0)
MCHC: 33.5 g/dL (ref 30.0–36.0)
MCV: 88.6 fL (ref 78.0–100.0)
PLATELETS: 114 10*3/uL — AB (ref 150–400)
RBC: 3.17 MIL/uL — ABNORMAL LOW (ref 3.87–5.11)
RDW: 14.6 % (ref 11.5–15.5)
WBC: 9.9 10*3/uL (ref 4.0–10.5)

## 2016-09-06 LAB — RPR: RPR Ser Ql: NONREACTIVE

## 2016-09-06 MED ORDER — RHO D IMMUNE GLOBULIN 1500 UNIT/2ML IJ SOSY
300.0000 ug | PREFILLED_SYRINGE | Freq: Once | INTRAMUSCULAR | Status: AC
  Administered 2016-09-06: 300 ug via INTRAVENOUS
  Filled 2016-09-06: qty 2

## 2016-09-06 NOTE — Lactation Note (Signed)
This note was copied from a baby's chart. Lactation Consultation Note  P4, Baby 27 hrs old.  Baby < 6 lbs.  1968w1d.  Baby on phototherapy. Discussed with mother how adding extra volume via supplementation can help decrease jaundice. Mother has been set up with double electric breast pump but has not pumped yet. Recommend hand express before feedings and pumping. Encouraged her to pump or discussed if she did not want to pump she could supplement with formula. Mother states baby is breastfeeding fine.  Offered assistance but mother declined.   Patient Name: Christine Greene [id number]'XToday's Date: 09/06/2016 Reason for consult: Follow-up assessment   Maternal Data    Feeding Length of feed: 12 min  LATCH Score/Interventions                      Lactation Tools Discussed/Used     Consult Status Consult Status: Follow-up Date: 09/07/16 Follow-up type: In-patient    Dahlia ByesBerkelhammer, Ruth [facility] 09/06/2016, 2:57 PM

## 2016-09-06 NOTE — Progress Notes (Signed)
Post Partum Day 1 Subjective: no complaints, up ad lib, voiding, tolerating PO, + flatus and breast feeding  Objective: Blood pressure 124/77, pulse 79, temperature 98.2 F (36.8 C), temperature source Oral, resp. rate 18, height 4\' 11"  (1.499 m), weight 83.9 kg (185 lb), last menstrual period 12/11/2014, SpO2 100 %, unknown if currently breastfeeding.  Physical Exam:  General: alert, cooperative and no distress Lochia: appropriate Uterine Fundus: firm Incision: healing well DVT Evaluation: No evidence of DVT seen on physical exam. Negative Homan's sign. No cords or calf tenderness. No significant calf/ankle edema.   Recent Labs  09/05/16 1217 09/06/16 0528  HGB 10.5* 9.4*  HCT 31.1* 28.1*    Assessment/Plan: Breastfeeding  Continue current routine post partum care   LOS: 1 day   Klynn Linnemann STACIA 09/06/2016, 9:37 AM

## 2016-09-07 ENCOUNTER — Ambulatory Visit: Payer: Self-pay

## 2016-09-07 LAB — RH IG WORKUP (INCLUDES ABO/RH)
ABO/RH(D): B NEG
FETAL SCREEN: NEGATIVE
GESTATIONAL AGE(WKS): 37.1
Unit division: 0

## 2016-09-07 MED ORDER — IBUPROFEN 600 MG PO TABS: 600.0000 mg | ORAL_TABLET | Freq: Four times a day (QID) | ORAL | 3 refills | Status: DC | PRN

## 2016-09-07 NOTE — Progress Notes (Signed)
Post Partum Day 2 Subjective: no complaints, up ad lib, voiding and tolerating PO  Breast feeding, mother and baby are bonding well  Objective: Blood pressure (!) 129/56, pulse 89, temperature 97.7 F (36.5 C), temperature source Oral, resp. rate 18, height 4\' 11"  (1.499 m), weight 83.9 kg (185 lb), last menstrual period 12/11/2014, SpO2 100 %, unknown if currently breastfeeding.  Physical Exam:  General: alert, cooperative and no distress Lochia: appropriate Uterine Fundus: firm perineum: healing well, no significant drainage, no dehiscence, no significant erythema DVT Evaluation: No evidence of DVT seen on physical exam. Negative Homan's sign. No cords or calf tenderness. No significant calf/ankle edema.   Recent Labs  09/05/16 1217 09/06/16 0528  HGB 10.5* 9.4*  HCT 31.1* 28.1*    Assessment/Plan: Discharge home and Breastfeeding  F/u in office in 4 weeks If baby has to stay d/t bili lights patient will be made "baby pt"   LOS: 2 days   Jerick Khachatryan STACIA 09/07/2016, 7:09 AM

## 2016-09-07 NOTE — Lactation Note (Signed)
This note was copied from a baby's chart. Lactation Consultation Note  Patient Name: Christine Greene ZOXWR'UToday's Date: 09/07/2016 Reason for consult: Follow-up assessment;Hyperbilirubinemia;Infant < 6lbs;Other (Comment) (early term at 37.1 wks. ) Baby under double photo therapy. Weight loss at 8.1% with 4.7% in past 24 hours. Good output per chart review. Baby has been to breast 10 times for 9-28 minutes. Baby now 2446 hours old. Mom has not been supplementing and does not want to use formula. Mom very tearful this am.  LC assisted Mom with positioning to keep bili blanket on baby while at breast. Mom can latch baby independently. Baby demonstrates some good suckling bursts with some swallowing motions observed. Intermittent chewing observed as well. Advised parents baby needs to be at breast 8-12 times in 24 hours and with feeding ques. Try to keep baby nursing for 15-30 minutes both breasts some feedings as long as able to nurse with bili blanket on. If not limit time at breast to 30 minutes. Mom to post pump every 3 hours for 15 minutes followed by 5 minutes of hand expression. Mom to call LC if able to obtain and EBM so LC can assist Mom with supplementing at breast using 5 fr feeding tube/syringe.   Maternal Data    Feeding Feeding Type: Breast Fed Length of feed: 30 min  LATCH Score/Interventions Latch: Grasps breast easily, tongue down, lips flanged, rhythmical sucking.  Audible Swallowing: A few with stimulation  Type of Nipple: Everted at rest and after stimulation  Comfort (Breast/Nipple): Filling, red/small blisters or bruises, mild/mod discomfort  Problem noted: Mild/Moderate discomfort Interventions (Mild/moderate discomfort): Hand massage;Hand expression (EBM for nipple tenderness, no breakdown noted)  Hold (Positioning): Assistance needed to correctly position infant at breast and maintain latch. Intervention(s): Breastfeeding basics reviewed;Support Pillows;Position  options;Skin to skin  LATCH Score: 7  Lactation Tools Discussed/Used Tools: Pump Breast pump type: Double-Electric Breast Pump   Consult Status Consult Status: Follow-up Date: 09/07/16 Follow-up type: In-patient    Alfred LevinsGranger, Jigar Zielke Ann 09/07/2016, 10:30 AM

## 2016-09-07 NOTE — Discharge Summary (Signed)
Obstetric Discharge Summary Reason for Admission: induction of labor for gestational hypertension  Prenatal Procedures: NST and ultrasound Intrapartum Procedures: spontaneous vaginal delivery Postpartum Procedures: none Complications-Operative and Postpartum: none Hemoglobin  Date Value Ref Range Status  09/06/2016 9.4 (L) 12.0 - 15.0 g/dL Final   HCT  Date Value Ref Range Status  09/06/2016 28.1 (L) 36.0 - 46.0 % Final    Physical Exam:  General: alert, cooperative and no distress Lochia: appropriate Uterine Fundus: firm perineum: healing well, no significant drainage, no dehiscence, no significant erythema DVT Evaluation: No evidence of DVT seen on physical exam. Negative Homan's sign. No cords or calf tenderness. No significant calf/ankle edema.  Discharge Diagnoses: Term Pregnancy-delivered  Discharge Information: Date: 09/07/2016 Activity: pelvic rest Diet: routine Medications: PNV and Ibuprofen Condition: stable Instructions: refer to practice specific booklet Discharge to: home   Newborn Data: Live born female  Birth Weight: 5 lb 14.8 oz (2688 g) APGAR: 8, 9  Home with mother.  Essie HartINN, Zarin Hagmann STACIA 09/07/2016, 7:13 AM

## 2016-09-07 NOTE — Lactation Note (Signed)
This note was copied from a baby's chart. Lactation Consultation Note  Patient Name: Christine Greene WNUUV'OToday's Date: 09/07/2016 Reason for consult: Follow-up assessment;Hyperbilirubinemia;Infant < 6lbs;Other (Comment) (early term 3137.1) Mom has been getting drops with pumping today. Reviewed weight loss with Mom again and per [facility] graphing baby between 90-95% per last weight check at [facility]0015 09/07/16. Assisted with hand expression at this visit and obtained few drops. Baby has been to breast for 25 minutes and acting very hungry. LC observed part of feeding, no swallows noted and intermittent chewing continues.  Mom agrees to supplement at breast. Demonstrated how to use 5 fr feeding tube/syringe at breast. Baby took 7 ml of Alimentum then fell asleep. Discussed with Mom LPT behaviors and hand out given since this baby just over 37 weeks. Encouraged to increase supplement to 20 ml every 3 hours to follow LPT policy.  Discussed following feeding plan: Baby should be at breast 8-12 times in 24 hours and with feeding ques. If baby not giving feeding ques, wake baby to BF. Start feeding on 1st breast without supplement. Let baby nurse for 15-20 minutes. Give supplement using 5 fr feeding tube/sryinge at breast with baby nursing on 2nd breast. Alternate each feeding which breast Mom starts the feeding and which breast she give supplement. Continue to post pump for 15 minutes to encourage milk production and to have EBM to supplement. Keep bili blanket on baby with nursing or as previously discussed. Call for assist as needed.   Maternal Data    Feeding Feeding Type: Formula Length of feed: 10 min  LATCH Score/Interventions Latch: Grasps breast easily, tongue down, lips flanged, rhythmical sucking.  Audible Swallowing: A few with stimulation  Type of Nipple: Everted at rest and after stimulation  Comfort (Breast/Nipple): Filling, red/small blisters or bruises, mild/mod  discomfort  Problem noted: Mild/Moderate discomfort Interventions (Mild/moderate discomfort): Hand massage;Hand expression;Post-pump  Hold (Positioning): Assistance needed to correctly position infant at breast and maintain latch.  LATCH Score: 7  Lactation Tools Discussed/Used Tools: Pump;43F feeding tube / Syringe Breast pump type: Double-Electric Breast Pump   Consult Status Consult Status: Follow-up Date: 09/08/16 Follow-up type: In-patient    Christine Greene, Christine Greene 09/07/2016, 4:29 PM

## 2016-09-08 ENCOUNTER — Ambulatory Visit: Payer: Self-pay

## 2016-09-08 NOTE — Lactation Note (Signed)
This note was copied from a baby's chart. Lactation Consultation Note  Patient Name: Christine Greene NWGNF'AToday's Date: 09/08/2016 Reason for consult: Follow-up assessment;Infant < 6lbs;Other (Comment) (early term baby) Mom reports she feels baby is nursing better and getting more at breast. Per chart review, baby has been to breast 11 times in past 24 hours for 10-40 minutes. Mom has supplemented 3 times, 7-10 ml of Alimentum. Mom getting few drops with pumping. Baby has had 8 voids/1 stool in past 24 hours. Bili lights have been d/c, rebound bili check later today.  Baby now at 10% weight loss but lost 1.9% in past 24 hours. Advised Mom to continue to BF with feeding ques, 8-12 times or more in 24 hours. Encouraged to continue to post pump and give baby back any amount of EBM she receives till weight loss stabilizes. Mom reports she feels her breast changing since during the night. Encouraged to call LC to observe feeding before d/c today due to intermittent chewing at breast observed yesterday. Mom has LC phone number to call. Advised of OP services, Mom will call if desired. Advised of support group.   Maternal Data    Feeding Length of feed: 15 min  LATCH Score/Interventions                      Lactation Tools Discussed/Used Tools: Pump;58F feeding tube / Syringe Breast pump type: Double-Electric Breast Pump   Consult Status Consult Status: Follow-up Date: 09/08/16 Follow-up type: In-patient    Alfred LevinsGranger, Lonnie Rosado Ann 09/08/2016, 12:41 PM

## 2016-10-08 NOTE — H&P (Signed)
Christine Greene is a 31 y.o. female presenting for IOL fro gestational hypertension  31 yo [phone] @ 37+0 presents for IOL for gestational hypertension. She did have some preterm labor and was treated with tocolytics and steroid administration. She has been followed with 2X/week NSTs since diagnosis OB History as of 09/20/16    Gravida Para Term Preterm AB Living   SAB TAB Ectopic Multiple Live Births   4 0 Past Medical History:  Diagnosis Date  . Pregnancy induced hypertension   . Preterm labor    Past Surgical History:  Procedure Laterality Date  . DILATION AND CURETTAGE OF UTERUS    . ECTOPIC PREGNANCY SURGERY  2007  . LAPAROSCOPY FOR ECTOPIC PREGNANCY    . VAGINAL DELIVERY  11/05/2011   Procedure: VAGINAL DELIVERY;  Surgeon: Levi Aland, MD;  Location: [facility];  Service: Gynecology;  Laterality: N/A;   Family History: family history includes Diabetes in her father. Social History:  reports that she has never smoked. She has never used smokeless tobacco. She reports that she does not drink alcohol or use drugs.     Maternal Diabetes: No Genetic Screening: Normal Maternal Ultrasounds/Referrals: Normal Fetal Ultrasounds or other Referrals:  None Maternal Substance Abuse:  No Significant Maternal Medications:  None Significant Maternal Lab Results:  None Other Comments:  None  ROS History Dilation:  (svd) Effacement (%): 100 Station: +3 Exam by:: LIndsay lima, rn Blood pressure (!) 129/56, pulse 89, temperature 97.7 F (36.5 C), temperature source Oral, resp. rate 18, height  (1.499 m), weight 83.9 kg (185 lb), last menstrual period 12/11/2014, SpO2 100 %, unknown if currently breastfeeding. Exam Physical Exam  Prenatal labs: ABO, Rh: --/--/B NEG (03/09 0528) Antibody: NEG (03/08 0123) Rubella: Immune (09/05 0000) RPR: Non Reactive (03/08 0123)  HBsAg: Negative (09/05 0000)  HIV: Non-reactive (09/05 0000)  GBS:    Neg  Assessment/Plan: 1) Admit 2) AROM/pit 3) Labetalol IV per protocol for elevated BPs 4) Anticipate SVD   Christine Greene H. 10/08/2016, 1:37 PM

## 2018-11-20 IMAGING — CR DG HUMERUS 2V *R*
2 series · 2 of 2 positions shown · non-contrast
Comparison: None.

CLINICAL DATA: 30 y/o  F; status post fall with mid humerus pain.

EXAM:
RIGHT HUMERUS - 2+ VIEW

[humerus ap]
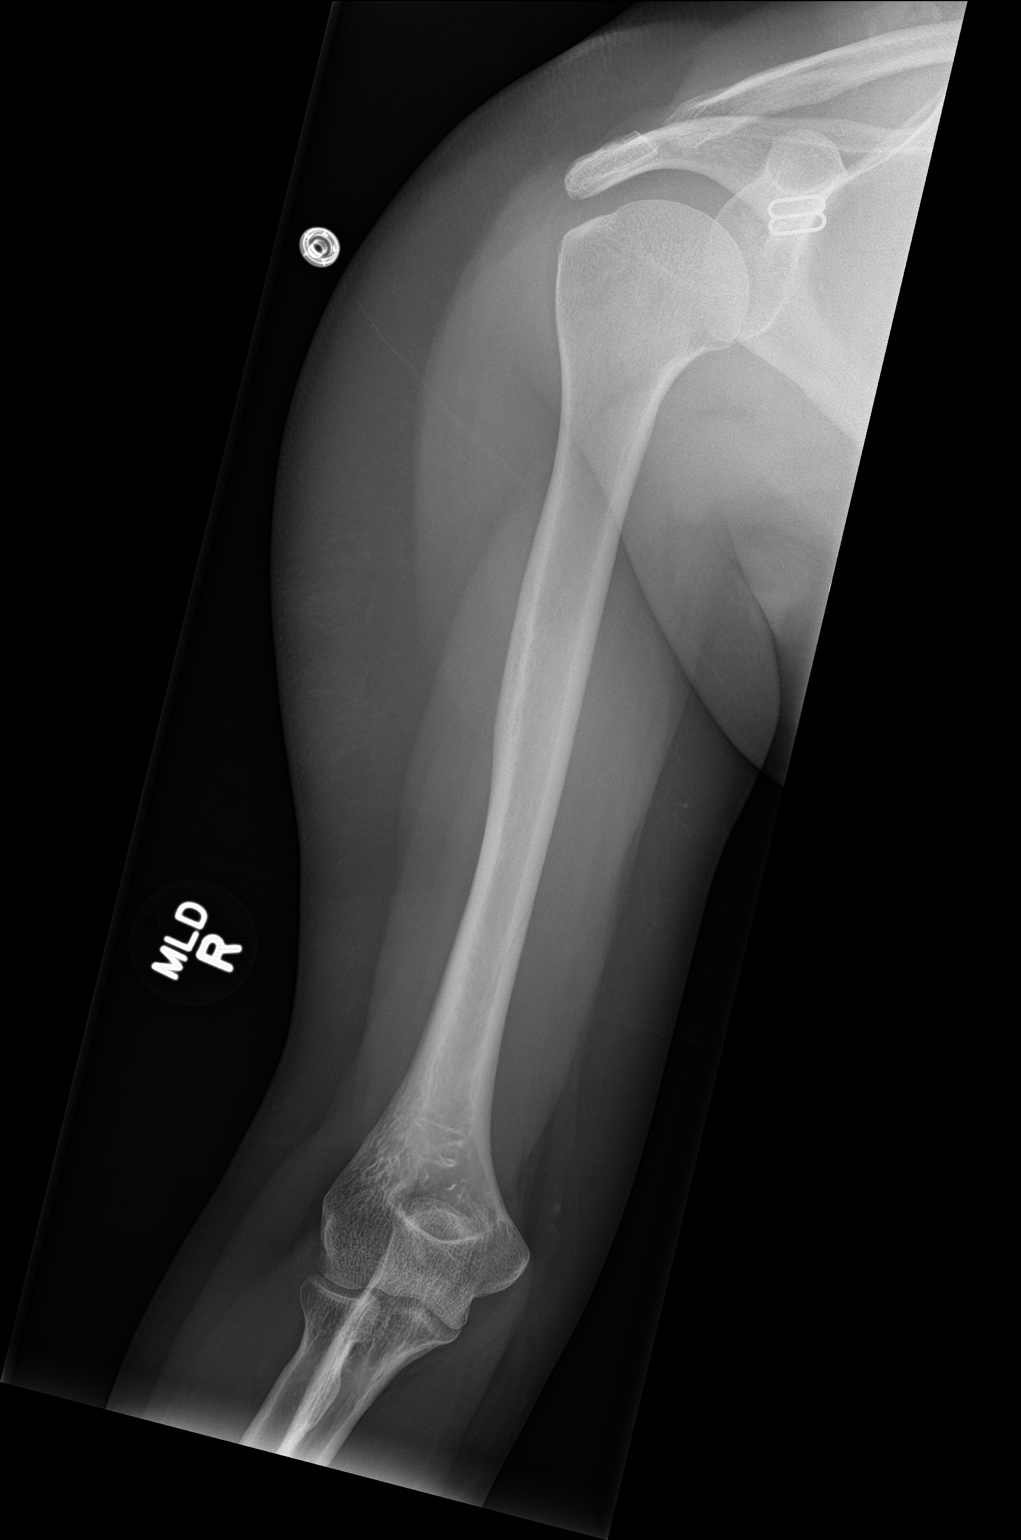

[humerus lat]
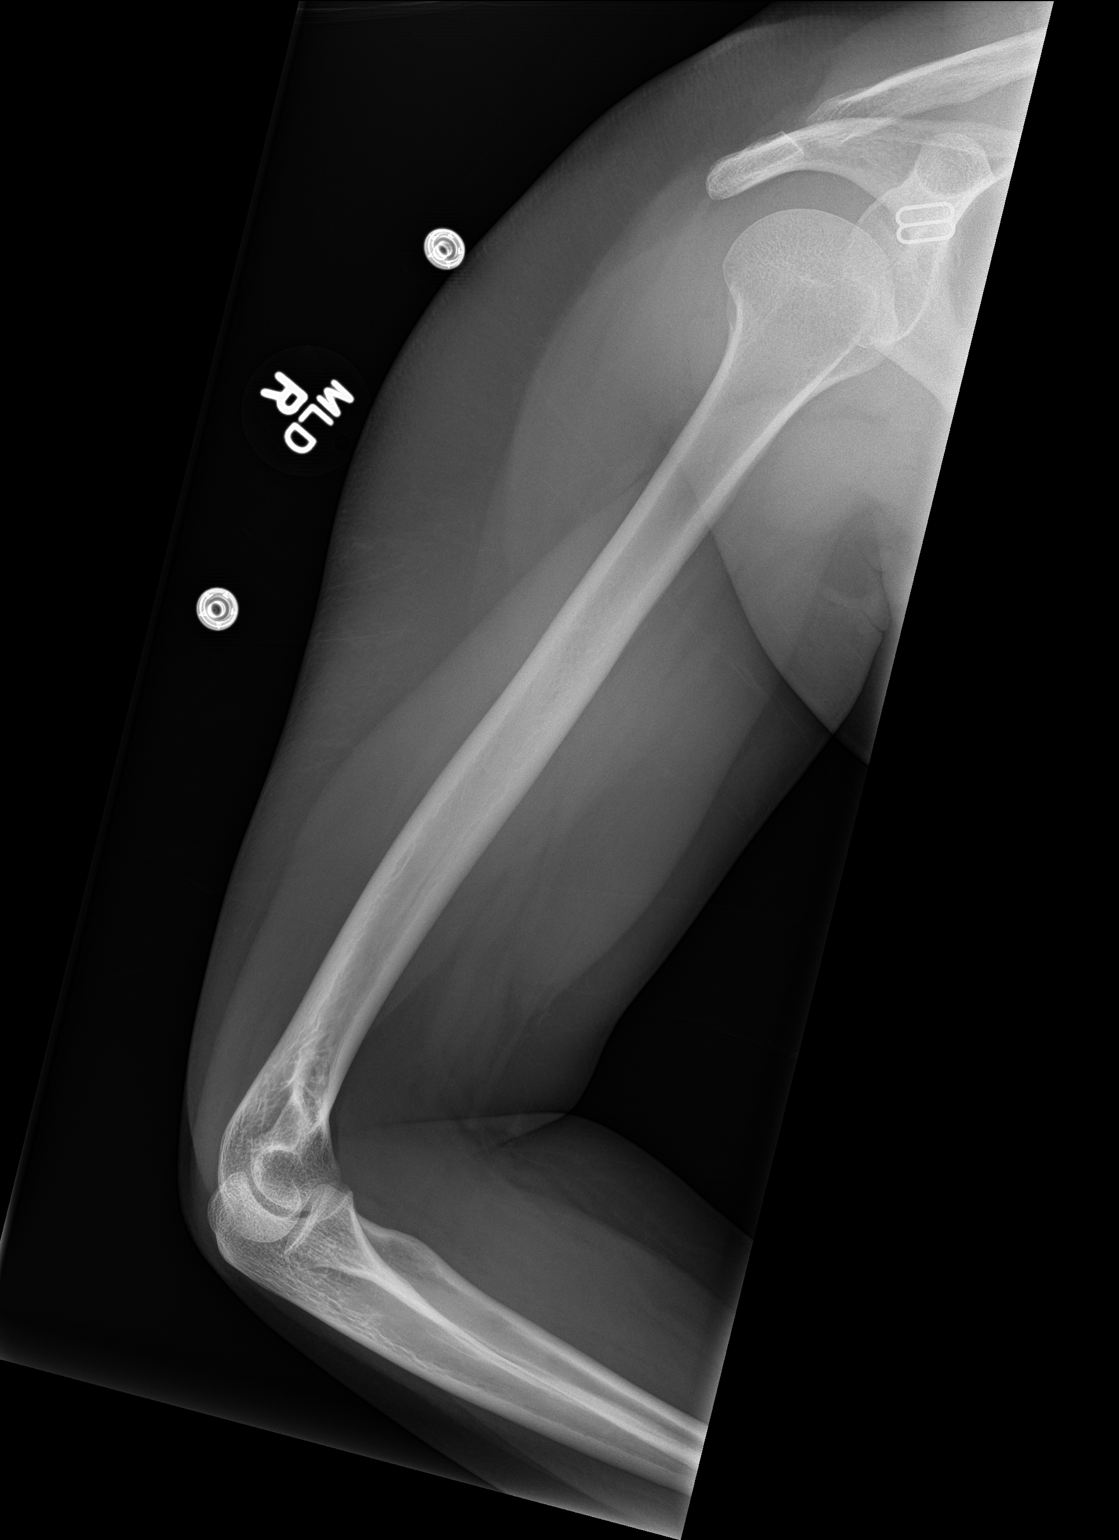

[2 of 2 positions shown; findings below may reference images not displayed]

FINDINGS: There is no evidence of fracture or other focal bone lesions. Soft
tissues are unremarkable.
IMPRESSION: Negative.

By: Everett Mota M.D.

## 2020-07-31 ENCOUNTER — Inpatient Hospital Stay (HOSPITAL_COMMUNITY)
Admission: AD | Admit: 2020-07-31 | Discharge: 2020-07-31 | Disposition: A | Discharge status: Home / Self Care | Payer: BC Managed Care – PPO | Attending: Obstetrics and Gynecology | Admitting: Obstetrics and Gynecology

## 2020-07-31 ENCOUNTER — Other Ambulatory Visit: Payer: Self-pay

## 2020-07-31 ENCOUNTER — Other Ambulatory Visit: Payer: Self-pay | Admitting: Obstetrics and Gynecology

## 2020-07-31 DIAGNOSIS — D649 Anemia, unspecified: Secondary | ICD-10-CM

## 2020-07-31 DIAGNOSIS — Z3202 Encounter for pregnancy test, result negative: Secondary | ICD-10-CM

## 2020-07-31 DIAGNOSIS — D696 Thrombocytopenia, unspecified: Secondary | ICD-10-CM

## 2020-07-31 DIAGNOSIS — R1030 Lower abdominal pain, unspecified: Secondary | ICD-10-CM | POA: Diagnosis not present

## 2020-07-31 DIAGNOSIS — N939 Abnormal uterine and vaginal bleeding, unspecified: Secondary | ICD-10-CM | POA: Diagnosis present

## 2020-07-31 DIAGNOSIS — R102 Pelvic and perineal pain: Secondary | ICD-10-CM

## 2020-07-31 LAB — POCT PREGNANCY, URINE: Preg Test, Ur: NEGATIVE

## 2020-07-31 LAB — CBC
HCT: 41.7 % (ref 36.0–46.0)
Hemoglobin: 14.2 g/dL (ref 12.0–15.0)
MCH: 29.6 pg (ref 26.0–34.0)
MCHC: 34.1 g/dL (ref 30.0–36.0)
MCV: 87.1 fL (ref 80.0–100.0)
Platelets: 256 10*3/uL (ref 150–400)
RBC: 4.79 MIL/uL (ref 3.87–5.11)
RDW: 13 % (ref 11.5–15.5)
WBC: 6.3 10*3/uL (ref 4.0–10.5)
nRBC: 0 % (ref 0.0–0.2)

## 2020-07-31 LAB — HCG, QUANTITATIVE, PREGNANCY: hCG, Beta Chain, Quant, S: 1 m[IU]/mL (ref <5)

## 2020-07-31 NOTE — MAU Provider Note (Signed)
S [name] is a 35 y.o. [phone] female with unsure LMP who presents to MAU today with complaint of VB and +HPT. Reports onset of VB 6 days ago. Bleeding was heavy initially but is light now. Reports low abdominal cramping. Has used Ibuprofen and Tylenol which helps. She was seen in the office today and had qhcg drawn but result is not back yet and was told to come to MAU for [place].    ROS: +VB +cramping  O BP 106/75 (BP Location: Right Arm)   Pulse 77   Temp 98.3 F (36.8 C) (Oral)   Resp 20   Ht 4\' 11"  (1.499 m)   Wt 72.8 kg   LMP  (LMP Unknown)   SpO2 98%   BMI 32.42 kg/m  Physical Exam Vitals and nursing note reviewed.  Constitutional:      General: She is not in acute distress.    Appearance: Normal appearance.  HENT:     Head: Normocephalic and atraumatic.  Cardiovascular:     Rate and Rhythm: Normal rate.  Pulmonary:     Effort: Pulmonary effort is normal. No respiratory distress.  Neurological:     General: No focal deficit present.     Mental Status: She is alert and oriented to person, place, and time.  Psychiatric:        Mood and Affect: Mood normal.        Behavior: Behavior normal.    Results for orders placed or performed during the hospital encounter of 07/31/20 (from the past 24 hour(s))  Pregnancy, urine POC     Status: None   Collection Time: 07/31/20  4:32 PM  Result Value Ref Range   Preg Test, Ur NEGATIVE NEGATIVE  CBC     Status: None   Collection Time: 07/31/20  5:02 PM  Result Value Ref Range   WBC 6.3 4.0 - 10.5 K/uL   RBC 4.79 3.87 - 5.11 MIL/uL   Hemoglobin 14.2 12.0 - 15.0 g/dL   HCT 08/02/20 50.5 - 39.7 %   MCV 87.1 80.0 - 100.0 fL   MCH 29.6 26.0 - 34.0 pg   MCHC 34.1 30.0 - 36.0 g/dL   RDW 67.3 41.9 - 37.9 %   Platelets 256 150 - 400 K/uL   nRBC 0.0 0.0 - 0.2 %  hCG, quantitative, pregnancy     Status: None   Collection Time: 07/31/20  5:02 PM  Result Value Ref Range   hCG, Beta Chain, Quant, S 1 <5 mIU/mL   MDM: Labs  ordered and reviewed. HCG negative. Discussed findings with pt. Recommend PNV and Fe rich foods. Recommend rpt platelets in 2-4 weeks. Stable for discharge home.   A 1. Negative pregnancy test   2. Anemia, unspecified type   3. Thrombocytopenia (HCC)    P Discharge from MAU in stable condition  Warning signs for worsening condition that would warrant emergency follow-up discussed Patient may return to MAU as needed for pregnancy related complaints Follow up with [facility] in 1-2 weeks  08/02/20, Donette Larry 07/31/2020 6:36 PM

## 2020-07-31 NOTE — MAU Note (Addendum)
Presents with c/o VB and abdominal cramping.  Reports has been bleeding since last Tuesday, changing sanitary napkins every 3 hours.  Unsure LMP, reports irregular cycles.  States +HPT. Also reports seen @ MD office today, but no ultrasonographer available to perform ultrasound, came to MAU for evaluation secondary Hx of ectopic and miscarriages.

## 2020-08-01 ENCOUNTER — Ambulatory Visit: Admission: RE | Admit: 2020-08-01 | Payer: Self-pay | Source: Ambulatory Visit

## 2020-08-13 ENCOUNTER — Emergency Department (HOSPITAL_COMMUNITY)
Admission: EM | Admit: 2020-08-13 | Discharge: 2020-08-13 | Disposition: A | Discharge status: Home / Self Care | Payer: BC Managed Care – PPO | Attending: Emergency Medicine | Admitting: Emergency Medicine

## 2020-08-13 ENCOUNTER — Other Ambulatory Visit: Payer: Self-pay

## 2020-08-13 ENCOUNTER — Encounter (HOSPITAL_COMMUNITY): Payer: Self-pay | Admitting: Emergency Medicine

## 2020-08-13 ENCOUNTER — Emergency Department (HOSPITAL_COMMUNITY): Payer: BC Managed Care – PPO

## 2020-08-13 DIAGNOSIS — R1032 Left lower quadrant pain: Secondary | ICD-10-CM | POA: Diagnosis not present

## 2020-08-13 DIAGNOSIS — R102 Pelvic and perineal pain: Secondary | ICD-10-CM | POA: Insufficient documentation

## 2020-08-13 DIAGNOSIS — R1031 Right lower quadrant pain: Secondary | ICD-10-CM | POA: Diagnosis not present

## 2020-08-13 LAB — URINALYSIS, ROUTINE W REFLEX MICROSCOPIC
Glucose, UA: NEGATIVE mg/dL
Hgb urine dipstick: NEGATIVE
Ketones, ur: NEGATIVE mg/dL
Leukocytes,Ua: NEGATIVE
Nitrite: NEGATIVE
Protein, ur: 30 mg/dL — AB
Specific Gravity, Urine: >1.03 — ABNORMAL HIGH (ref 1.005–1.030)
pH: 6 (ref 5.0–8.0)

## 2020-08-13 LAB — I-STAT BETA HCG BLOOD, ED (MC, WL, AP ONLY): I-stat hCG, quantitative: <5 m[IU]/mL (ref <5)

## 2020-08-13 LAB — COMPREHENSIVE METABOLIC PANEL
ALT: 26 U/L (ref 0–44)
AST: 31 U/L (ref 15–41)
Albumin: 4.2 g/dL (ref 3.5–5.0)
Alkaline Phosphatase: 67 U/L (ref 38–126)
Anion gap: 13 (ref 5–15)
BUN: 7 mg/dL (ref 6–20)
CO2: 24 mmol/L (ref 22–32)
Calcium: 9.5 mg/dL (ref 8.9–10.3)
Chloride: 100 mmol/L (ref 98–111)
Creatinine, Ser: 0.84 mg/dL (ref 0.44–1.00)
GFR, Estimated: >60 mL/min (ref >60)
Glucose, Bld: 112 mg/dL — ABNORMAL HIGH (ref 70–99)
Potassium: 3.5 mmol/L (ref 3.5–5.1)
Sodium: 137 mmol/L (ref 135–145)
Total Bilirubin: 0.5 mg/dL (ref 0.3–1.2)
Total Protein: 6.8 g/dL (ref 6.5–8.1)

## 2020-08-13 LAB — WET PREP, GENITAL
Clue Cells Wet Prep HPF POC: NONE SEEN
Sperm: NONE SEEN
Trich, Wet Prep: NONE SEEN
WBC, Wet Prep HPF POC: NONE SEEN
Yeast Wet Prep HPF POC: NONE SEEN

## 2020-08-13 LAB — CBC
HCT: 43 % (ref 36.0–46.0)
Hemoglobin: 14.5 g/dL (ref 12.0–15.0)
MCH: 30 pg (ref 26.0–34.0)
MCHC: 33.7 g/dL (ref 30.0–36.0)
MCV: 88.8 fL (ref 80.0–100.0)
Platelets: 196 10*3/uL (ref 150–400)
RBC: 4.84 MIL/uL (ref 3.87–5.11)
RDW: 12.8 % (ref 11.5–15.5)
WBC: 4.4 10*3/uL (ref 4.0–10.5)
nRBC: 0 % (ref 0.0–0.2)

## 2020-08-13 LAB — LIPASE, BLOOD: Lipase: 29 U/L (ref 11–51)

## 2020-08-13 LAB — URINALYSIS, MICROSCOPIC (REFLEX)

## 2020-08-13 MED ORDER — ONDANSETRON HCL 4 MG/2ML IJ SOLN
4.0000 mg | Freq: Once | INTRAMUSCULAR | Status: AC
  Administered 2020-08-13: 4 mg via INTRAVENOUS
  Filled 2020-08-13: qty 2

## 2020-08-13 MED ORDER — MORPHINE SULFATE (PF) 4 MG/ML IV SOLN
4.0000 mg | Freq: Once | INTRAVENOUS | Status: AC
  Administered 2020-08-13: 4 mg via INTRAVENOUS
  Filled 2020-08-13: qty 1

## 2020-08-13 MED ORDER — SODIUM CHLORIDE 0.9 % IV SOLN
1.0000 g | Freq: Once | INTRAVENOUS | Status: AC
  Administered 2020-08-13: 1 g via INTRAVENOUS
  Filled 2020-08-13: qty 10

## 2020-08-13 MED ORDER — KETOROLAC TROMETHAMINE 30 MG/ML IJ SOLN
30.0000 mg | Freq: Once | INTRAMUSCULAR | Status: AC
  Administered 2020-08-13: 30 mg via INTRAVENOUS
  Filled 2020-08-13: qty 1

## 2020-08-13 MED ORDER — MORPHINE SULFATE (PF) 4 MG/ML IV SOLN
4.0000 mg | Freq: Once | INTRAVENOUS | Status: DC
  Filled 2020-08-13: qty 1

## 2020-08-13 MED ORDER — SODIUM CHLORIDE 0.9 % IV SOLN
1000.0000 mL | INTRAVENOUS | Status: DC
  Administered 2020-08-13: 1000 mL via INTRAVENOUS

## 2020-08-13 MED ORDER — DOXYCYCLINE HYCLATE 100 MG PO TABS: 100.0000 mg | ORAL_TABLET | Freq: Two times a day (BID) | ORAL | 0 refills | Status: DC

## 2020-08-13 MED ORDER — SODIUM CHLORIDE 0.9 % IV BOLUS (SEPSIS)
1000.0000 mL | Freq: Once | INTRAVENOUS | Status: AC
  Administered 2020-08-13: 1000 mL via INTRAVENOUS

## 2020-08-13 MED ORDER — NAPROXEN 375 MG PO TABS: 375.0000 mg | ORAL_TABLET | Freq: Two times a day (BID) | ORAL | 0 refills | Status: DC

## 2020-08-13 NOTE — ED Triage Notes (Signed)
Pt states she believes she has "over stimulated ovaries".  Reports pelvic pain since having a miscarriage 3 weeks ago.  Denies nausea, vomiting, and diarrhea.  Denies urinary complaints.

## 2020-08-13 NOTE — ED Notes (Signed)
Patient transported to Ultrasound 

## 2020-08-13 NOTE — ED Provider Notes (Signed)
[facility] EMERGENCY DEPARTMENT Provider Note   CSN: [id number] Arrival date & time: 08/13/20  1342     History Chief Complaint  Patient presents with  . Pelvic Pain    Christine Greene is a 35 y.o. female.  HPI   Pt has history of recent miscarriage 2.5 weeks ago.  Since that time she has been having pain that has been increasing in severity.  The pain is in the bilateral lower abdomen.  She feels like her lower abdomen is swollen.  It does radiate all over.  No urinary symptoms.  No fevers.    No longer having vaginal bleeding.  Patient feels like her ovaries are overstimulated.  She has had fertility treatments in the past and that is what this feels similar to.  She has not been on any fertility medications with this recent pregnancy though.  OB is Dr Henderson Cloud.   She spoke with Dr. Henderson Cloud today who suggested she come to the ED for further evaluation considering her worsening pain .  NO trouble with appetite.  No nausea or vomiting.  NO diarrhea or constipation.  Patient has had a prior appendectomy.  Past Medical History:  Diagnosis Date  . Pregnancy induced hypertension   . Preterm labor     Patient Active Problem List   Diagnosis Date Noted  . PIH (pregnancy induced hypertension) 09/05/2016  . Spontaneous vaginal delivery 09/05/2016  . Threatened preterm labor, third trimester 08/14/2016    Past Surgical History:  Procedure Laterality Date  . DILATION AND CURETTAGE OF UTERUS    . ECTOPIC PREGNANCY SURGERY  2007  . LAPAROSCOPY FOR ECTOPIC PREGNANCY    . VAGINAL DELIVERY  11/05/2011   Procedure: VAGINAL DELIVERY;  Surgeon: Levi Aland, MD;  Location: [facility];  Service: Gynecology;  Laterality: N/A;     OB History    Gravida  8   Para  3   Term  2   Preterm  1   AB  5   Living  4     SAB  4   IAB  0   Ectopic  1   Multiple  1   Live Births  4           Family History  Problem Relation Age of Onset  . Diabetes Father      Social History   Tobacco Use  . Smoking status: Never Smoker  . Smokeless tobacco: Never Used  Substance Use Topics  . Alcohol use: No  . Drug use: No    Home Medications Prior to Admission medications   Medication Sig Start Date End Date Taking? Authorizing Provider  doxycycline (VIBRA-TABS) 100 MG tablet Take 1 tablet (100 mg total) by mouth 2 (two) times daily. 08/13/20  Yes Linwood Dibbles, MD  escitalopram (LEXAPRO) 20 MG tablet Take 20 mg by mouth at bedtime. 09/14/15  Yes [provider]  naproxen (NAPROSYN) 375 MG tablet Take 1 tablet (375 mg total) by mouth 2 (two) times daily. 08/13/20  Yes Linwood Dibbles, MD  sulfamethoxazole-trimethoprim (BACTRIM DS) 800-160 MG tablet Take 1 tablet by mouth 2 (two) times daily. 08/03/20  Yes [provider]  SYEDA 3-0.03 MG tablet Take 1 tablet by mouth daily. 06/05/20  Yes [provider]  traZODone (DESYREL) 100 MG tablet Take 50 mg by mouth at bedtime as needed for sleep. 08/03/20  Yes [provider]  UBRELVY 100 MG TABS Take 100 mg by mouth as needed (migraines). 06/29/20  Yes [provider]  cyclobenzaprine (FLEXERIL) 5 MG tablet Take 1 tablet (5 mg total) by mouth 3 (three) times daily as needed for muscle spasms. Patient not taking: Reported on 08/13/2020 08/15/16   Essie HartPinn, Walda, MD  docusate sodium (COLACE) 100 MG capsule Take 1 capsule (100 mg total) by mouth 2 (two) times daily. Patient not taking: Reported on 08/13/2020 03/12/16   Thressa ShellerHogan, Heather D, CNM  ibuprofen (ADVIL) 800 MG tablet Take 800 mg by mouth 3 (three) times daily. Patient not taking: No sig reported 08/13/20   [provider]    Allergies    Patient has no known allergies.  Review of Systems   Review of Systems  All other systems reviewed and are negative.   Physical Exam Updated Vital Signs BP 93/81   Pulse 84   Temp 98.8 F (37.1 C)   Resp 16   LMP  (LMP Unknown)   SpO2 97%   Physical Exam Vitals and nursing  note reviewed. Exam conducted with a chaperone present.  Constitutional:      General: She is not in acute distress.    Appearance: She is well-developed and well-nourished.  HENT:     Head: Normocephalic and atraumatic.     Right Ear: External ear normal.     Left Ear: External ear normal.  Eyes:     General: No scleral icterus.       Right eye: No discharge.        Left eye: No discharge.     Conjunctiva/sclera: Conjunctivae normal.  Neck:     Trachea: No tracheal deviation.  Cardiovascular:     Rate and Rhythm: Normal rate and regular rhythm.     Pulses: Intact distal pulses.  Pulmonary:     Effort: Pulmonary effort is normal. No respiratory distress.     Breath sounds: Normal breath sounds. No stridor. No wheezing or rales.  Abdominal:     General: Bowel sounds are normal. There is no distension.     Palpations: Abdomen is soft.     Tenderness: There is abdominal tenderness in the right lower quadrant and left lower quadrant. There is no guarding or rebound.  Genitourinary:    General: Normal vulva.     Vagina: No foreign body. Vaginal discharge present. No erythema.     Cervix: Cervical motion tenderness present. No erythema or cervical bleeding.     Uterus: Tender.      Adnexa:        Right: Tenderness present. No mass.         Left: No mass.    Musculoskeletal:        General: No tenderness or edema.     Cervical back: Neck supple.  Skin:    General: Skin is warm and dry.     Findings: No rash.  Neurological:     Mental Status: She is alert.     Cranial Nerves: No cranial nerve deficit (no facial droop, extraocular movements intact, no slurred speech).     Sensory: No sensory deficit.     Motor: No abnormal muscle tone or seizure activity.     Coordination: Coordination normal.     Deep Tendon Reflexes: Strength normal.  Psychiatric:        Mood and Affect: Mood and affect normal.     ED Results / Procedures / Treatments   Labs (all labs ordered are listed,  but only abnormal results are displayed) Labs Reviewed  COMPREHENSIVE METABOLIC PANEL -  Abnormal; Notable for the following components:      Result Value   Glucose, Bld 112 (*)    All other components within normal limits  URINALYSIS, ROUTINE W REFLEX MICROSCOPIC - Abnormal; Notable for the following components:   Specific Gravity, Urine >1.030 (*)    Bilirubin Urine SMALL (*)    Protein, ur 30 (*)    All other components within normal limits  URINALYSIS, MICROSCOPIC (REFLEX) - Abnormal; Notable for the following components:   Bacteria, UA RARE (*)    All other components within normal limits  WET PREP, GENITAL  LIPASE, BLOOD  CBC  RPR  I-STAT BETA HCG BLOOD, ED ([facility], [facility], [facility] ONLY)  GC/CHLAMYDIA PROBE AMP ([facility]) NOT AT [facility]     Radiology US PELVIC COMPLETE W TRANSVAGINAL AND TORSION R/O  Result Date: 08/13/2020 CLINICAL DATA:  Bilateral pelvic pain for 2 weeks. Recent miscarriage. EXAM: TRANSABDOMINAL AND TRANSVAGINAL ULTRASOUND OF PELVIS DOPPLER ULTRASOUND OF OVARIES TECHNIQUE: Both transabdominal and transvaginal ultrasound examinations of the pelvis were performed. Transabdominal technique was performed for global imaging of the pelvis including uterus, ovaries, adnexal regions, and pelvic cul-de-sac. It was necessary to proceed with endovaginal exam following the transabdominal exam to visualize the endometrium and ovaries/adnexa to better advantage. Color and duplex Doppler ultrasound was utilized to evaluate blood flow to the ovaries. COMPARISON:  None. FINDINGS: Uterus Measurements: 7.4 x 3.3 x 4.1 cm = volume: 53 mL. No fibroids or other mass visualized. Endometrium Thickness: 5 mm.  No focal abnormality visualized. Right ovary Measurements: 2.5 x 1.7 x 1.8 cm = volume: 3.9 mL. Normal appearance/no adnexal mass. Left ovary Measurements: 3.2 x 2.1 x 2.7 cm = volume: 9.5 mL. Normal appearance/no adnexal mass. Pulsed Doppler evaluation of both ovaries demonstrates normal  low-resistance arterial and venous waveforms. Other findings No abnormal free fluid. IMPRESSION: Normal transabdominal and endovaginal pelvic ultrasound. No evidence of ovarian torsion. Electronically Signed   By: Amie Portland M.D.   On: 08/13/2020 16:51    Procedures Procedures   Medications Ordered in ED Medications  morphine 4 MG/ML injection 4 mg (4 mg Intravenous Not Given 08/13/20 1543)  sodium chloride 0.9 % bolus 1,000 mL (has no administration in time range)    Followed by  0.9 %  sodium chloride infusion (has no administration in time range)  cefTRIAXone (ROCEPHIN) 1 g in sodium chloride 0.9 % 100 mL IVPB (has no administration in time range)  ondansetron (ZOFRAN) injection 4 mg (4 mg Intravenous Given 08/13/20 1546)  morphine 4 MG/ML injection 4 mg (4 mg Intravenous Given 08/13/20 1558)    ED Course  I have reviewed the triage vital signs and the nursing notes.  Pertinent labs & imaging results that were available during my care of the patient were reviewed by me and considered in my medical decision making (see chart for details).    MDM Rules/Calculators/A&P                         Patient presented to ED with persistent pelvic pain after recent miscarriage.  Patient was not having any vomiting or diarrhea.  No GI symptoms.  I doubt diverticulitis or colitis.  Urinalysis not suggestive of UTI or nephrolithiasis.  I was concerned about the possibility of ovarian torsion, ovarian mass or infection.  Ultrasound does not show any acute abnormalities.  Patient did however have significant tenderness on exam.  Concerned about the possibility of cervicitis/PID although overall low risk.  With her persistent pain and tenderness we will start her on a course of antibiotics.  Close outpatient follow-up with her OB/GYN doctor Dr. Henderson Cloud. Final Clinical Impression(s) / ED Diagnoses Final diagnoses:  Pelvic pain in female    Rx / DC Orders ED Discharge Orders         Ordered     doxycycline (VIBRA-TABS) 100 MG tablet  2 times daily        08/13/20 1735    naproxen (NAPROSYN) 375 MG tablet  2 times daily        08/13/20 1735           Linwood Dibbles, MD 08/13/20 1745

## 2020-08-13 NOTE — Discharge Instructions (Signed)
Take the medications as prescribed.  Follow-up with Dr. Henderson Cloud for further evaluation.  Return as needed for worsening symptoms

## 2020-08-14 ENCOUNTER — Other Ambulatory Visit: Payer: Self-pay | Admitting: Obstetrics and Gynecology

## 2020-08-14 DIAGNOSIS — R102 Pelvic and perineal pain: Secondary | ICD-10-CM

## 2020-08-14 LAB — GC/CHLAMYDIA PROBE AMP (~~LOC~~) NOT AT ARMC
Chlamydia: NEGATIVE
Comment: NEGATIVE
Comment: NORMAL
Neisseria Gonorrhea: NEGATIVE

## 2021-07-31 ENCOUNTER — Other Ambulatory Visit: Payer: Self-pay | Admitting: Obstetrics and Gynecology

## 2021-07-31 DIAGNOSIS — R102 Pelvic and perineal pain: Secondary | ICD-10-CM

## 2022-10-21 ENCOUNTER — Emergency Department (HOSPITAL_COMMUNITY)
Admission: EM | Admit: 2022-10-21 | Discharge: 2022-10-21 | Disposition: A | Discharge status: Home / Self Care | Payer: BC Managed Care – PPO | Attending: Emergency Medicine | Admitting: Emergency Medicine

## 2022-10-21 ENCOUNTER — Emergency Department (HOSPITAL_COMMUNITY): Payer: BC Managed Care – PPO

## 2022-10-21 ENCOUNTER — Encounter (HOSPITAL_COMMUNITY): Payer: Self-pay

## 2022-10-21 DIAGNOSIS — N83201 Unspecified ovarian cyst, right side: Secondary | ICD-10-CM | POA: Diagnosis not present

## 2022-10-21 DIAGNOSIS — R1030 Lower abdominal pain, unspecified: Secondary | ICD-10-CM

## 2022-10-21 LAB — LIPASE, BLOOD: Lipase: 42 U/L (ref 11–51)

## 2022-10-21 LAB — COMPREHENSIVE METABOLIC PANEL
ALT: 21 U/L (ref 0–44)
AST: 24 U/L (ref 15–41)
Albumin: 4 g/dL (ref 3.5–5.0)
Alkaline Phosphatase: 72 U/L (ref 38–126)
Anion gap: 10 (ref 5–15)
BUN: 10 mg/dL (ref 6–20)
CO2: 26 mmol/L (ref 22–32)
Calcium: 8.9 mg/dL (ref 8.9–10.3)
Chloride: 100 mmol/L (ref 98–111)
Creatinine, Ser: 0.79 mg/dL (ref 0.44–1.00)
GFR, Estimated: >60 mL/min (ref >60)
Glucose, Bld: 111 mg/dL — ABNORMAL HIGH (ref 70–99)
Potassium: 3.5 mmol/L (ref 3.5–5.1)
Sodium: 136 mmol/L (ref 135–145)
Total Bilirubin: 0.4 mg/dL (ref 0.3–1.2)
Total Protein: 6.5 g/dL (ref 6.5–8.1)

## 2022-10-21 LAB — CBC WITH DIFFERENTIAL/PLATELET
Abs Immature Granulocytes: 0.02 10*3/uL (ref 0.00–0.07)
Basophils Absolute: 0 10*3/uL (ref 0.0–0.1)
Basophils Relative: 0 %
Eosinophils Absolute: 0.1 10*3/uL (ref 0.0–0.5)
Eosinophils Relative: 1 %
HCT: 40.9 % (ref 36.0–46.0)
Hemoglobin: 14.4 g/dL (ref 12.0–15.0)
Immature Granulocytes: 0 %
Lymphocytes Relative: 27 %
Lymphs Abs: 2 10*3/uL (ref 0.7–4.0)
MCH: 30.2 pg (ref 26.0–34.0)
MCHC: 35.2 g/dL (ref 30.0–36.0)
MCV: 85.7 fL (ref 80.0–100.0)
Monocytes Absolute: 0.5 10*3/uL (ref 0.1–1.0)
Monocytes Relative: 7 %
Neutro Abs: 4.7 10*3/uL (ref 1.7–7.7)
Neutrophils Relative %: 65 %
Platelets: 251 10*3/uL (ref 150–400)
RBC: 4.77 MIL/uL (ref 3.87–5.11)
RDW: 12 % (ref 11.5–15.5)
WBC: 7.3 10*3/uL (ref 4.0–10.5)
nRBC: 0 % (ref 0.0–0.2)

## 2022-10-21 LAB — URINALYSIS, ROUTINE W REFLEX MICROSCOPIC
Bilirubin Urine: NEGATIVE
Glucose, UA: NEGATIVE mg/dL
Hgb urine dipstick: NEGATIVE
Ketones, ur: NEGATIVE mg/dL
Leukocytes,Ua: NEGATIVE
Nitrite: NEGATIVE
Protein, ur: NEGATIVE mg/dL
Specific Gravity, Urine: 1.026 (ref 1.005–1.030)
pH: 5 (ref 5.0–8.0)

## 2022-10-21 LAB — I-STAT BETA HCG BLOOD, ED (MC, WL, AP ONLY): I-stat hCG, quantitative: <5 m[IU]/mL (ref <5)

## 2022-10-21 MED ORDER — ONDANSETRON 4 MG PO TBDP
4.0000 mg | ORAL_TABLET | Freq: Once | ORAL | Status: AC
  Administered 2022-10-21: 4 mg via ORAL
  Filled 2022-10-21: qty 1

## 2022-10-21 MED ORDER — IOHEXOL 350 MG/ML SOLN
75.0000 mL | Freq: Once | INTRAVENOUS | Status: AC | PRN
  Administered 2022-10-21: 75 mL via INTRAVENOUS

## 2022-10-21 MED ORDER — ONDANSETRON 4 MG PO TBDP: 4.0000 mg | ORAL_TABLET | Freq: Three times a day (TID) | ORAL | 0 refills | Status: DC | PRN

## 2022-10-21 MED ORDER — OXYCODONE-ACETAMINOPHEN 5-325 MG PO TABS
1.0000 | ORAL_TABLET | Freq: Once | ORAL | Status: AC
  Administered 2022-10-21: 1 via ORAL
  Filled 2022-10-21: qty 1

## 2022-10-21 MED ORDER — HYDROCODONE-ACETAMINOPHEN 5-325 MG PO TABS
1.0000 | ORAL_TABLET | Freq: Once | ORAL | Status: DC
  Filled 2022-10-21: qty 1

## 2022-10-21 NOTE — ED Provider Notes (Signed)
[place] EMERGENCY DEPARTMENT AT [facility] Provider Note   CSN: [id number] Arrival date & time: 10/21/22  1830     History  Chief Complaint  Patient presents with   Abdominal Pain    Christine Greene is a 37 y.o. female.   Abdominal Pain Associated symptoms: nausea      37 year old female presenting to the emergency department with lower abdominal pain.  The patient states that she has a history of "ovarian hyperstimulation" and states that she developed pain in her bilateral lower abdomen on Friday which came on suddenly.  She states that she has a history of prior appendectomy.  She denies any vaginal discharge, vaginal bleeding.  She is not concerned for STIs.  She endorses nausea, denies any vomiting, diarrhea, denies any urinary symptoms.  She took a home pregnancy test that was negative.  No radiation of the pain to the back.  No fevers or chills.  She is tolerating oral intake.  Home Medications Prior to Admission medications   Medication Sig Start Date End Date Taking? Authorizing Provider  escitalopram (LEXAPRO) 20 MG tablet Take 20 mg by mouth at bedtime. 09/14/15  Yes [provider]  Magnesium Hydroxide 400 MG CHEW Chew 1 tablet by mouth daily.   Yes [provider]  OVER THE COUNTER MEDICATION Take 3 tablets by mouth daily. Medication: [place]   Yes [provider]  traZODone (DESYREL) 100 MG tablet Take 50 mg by mouth at bedtime as needed for sleep. 08/03/20  Yes [provider]  cyclobenzaprine (FLEXERIL) 5 MG tablet Take 1 tablet (5 mg total) by mouth 3 (three) times daily as needed for muscle spasms. Patient not taking: Reported on 08/13/2020 08/15/16   Essie Hart, MD  docusate sodium (COLACE) 100 MG capsule Take 1 capsule (100 mg total) by mouth 2 (two) times daily. Patient not taking: Reported on 08/13/2020 03/12/16   Armando Reichert, CNM  doxycycline (VIBRA-TABS) 100 MG tablet Take 1 tablet (100 mg total) by mouth  2 (two) times daily. Patient not taking: Reported on 10/21/2022 08/13/20   Linwood Dibbles, MD  naproxen (NAPROSYN) 375 MG tablet Take 1 tablet (375 mg total) by mouth 2 (two) times daily. Patient not taking: Reported on 10/21/2022 08/13/20   Linwood Dibbles, MD  sulfamethoxazole-trimethoprim (BACTRIM DS) 800-160 MG tablet Take 1 tablet by mouth 2 (two) times daily. Patient not taking: Reported on 10/21/2022 08/03/20   [provider]  UBRELVY 100 MG TABS Take 100 mg by mouth as needed (migraines). Patient not taking: Reported on 10/21/2022 06/29/20   [provider]      Allergies    Patient has no known allergies.    Review of Systems   Review of Systems  Gastrointestinal:  Positive for abdominal pain and nausea.  All other systems reviewed and are negative.   Physical Exam Updated Vital Signs BP 101/66   Pulse 78   Temp 98 F (36.7 C)   Resp 18   SpO2 98%  Physical Exam Vitals and nursing note reviewed.  Constitutional:      General: She is not in acute distress.    Appearance: She is well-developed.  HENT:     Head: Normocephalic and atraumatic.  Eyes:     Conjunctiva/sclera: Conjunctivae normal.  Cardiovascular:     Rate and Rhythm: Normal rate and regular rhythm.     Heart sounds: No murmur heard. Pulmonary:     Effort: Pulmonary effort is normal. No respiratory distress.  Breath sounds: Normal breath sounds.  Abdominal:     Palpations: Abdomen is soft.     Tenderness: There is abdominal tenderness in the right lower quadrant, suprapubic area and left lower quadrant. There is no guarding or rebound.  Genitourinary:    Comments: GU exam deferred, patient without symptoms Musculoskeletal:        General: No swelling.     Cervical back: Neck supple.  Skin:    General: Skin is warm and dry.     Capillary Refill: Capillary refill takes less than 2 seconds.  Neurological:     Mental Status: She is alert.  Psychiatric:        Mood and Affect: Mood normal.      ED Results / Procedures / Treatments   Labs (all labs ordered are listed, but only abnormal results are displayed) Labs Reviewed  COMPREHENSIVE METABOLIC PANEL - Abnormal; Notable for the following components:      Result Value   Glucose, Bld 111 (*)    All other components within normal limits  CBC WITH DIFFERENTIAL/PLATELET  LIPASE, BLOOD  URINALYSIS, ROUTINE W REFLEX MICROSCOPIC  I-STAT BETA HCG BLOOD, ED ([facility], [facility], [facility] ONLY)    EKG None  Radiology CT ABDOMEN PELVIS W CONTRAST  Result Date: 10/21/2022 CLINICAL DATA:  Abdominal pain. EXAM: CT ABDOMEN AND PELVIS WITH CONTRAST TECHNIQUE: Multidetector CT imaging of the abdomen and pelvis was performed using the standard protocol following bolus administration of intravenous contrast. RADIATION DOSE REDUCTION: This exam was performed according to the departmental dose-optimization program which includes automated exposure control, adjustment of the mA and/or kV according to patient size and/or use of iterative reconstruction technique. CONTRAST:  75mL OMNIPAQUE IOHEXOL 350 MG/ML SOLN COMPARISON:  February 15, 2017 FINDINGS: Lower chest: No acute abnormality. Hepatobiliary: There is diffuse fatty infiltration of the liver parenchyma. No focal liver abnormality is seen. No gallstones, gallbladder wall thickening, or biliary dilatation. Pancreas: Unremarkable. No pancreatic ductal dilatation or surrounding inflammatory changes. Spleen: Normal in size without focal abnormality. Adrenals/Urinary Tract: Adrenal glands are unremarkable. Kidneys are normal, without renal calculi, focal lesion, or hydronephrosis. Bladder is unremarkable. Stomach/Bowel: Stomach is within normal limits. The appendix is not identified. No evidence of bowel wall thickening, distention, or inflammatory changes. Vascular/Lymphatic: No significant vascular findings are present. No enlarged abdominal or pelvic lymph nodes. Reproductive: The uterus and left adnexa are  unremarkable. A 2.5 cm diameter cyst is seen within the right adnexa. Other: No abdominal wall hernia or abnormality. No abdominopelvic ascites. Musculoskeletal: No acute or significant osseous findings. IMPRESSION: 1. Hepatic steatosis. 2. 2.5 cm right adnexal cyst, likely ovarian in origin. No follow-up imaging is recommended. This recommendation follows ACR consensus guidelines: White Paper of the ACR Incidental Findings Committee II on Adnexal Findings. J Am Coll Radiol [phone]. Electronically Signed   By: Aram Candela M.D.   On: 10/21/2022 23:09   US Pelvis Complete  Result Date: 10/21/2022 CLINICAL DATA:  Pelvic pain. EXAM: TRANSABDOMINAL AND TRANSVAGINAL ULTRASOUND OF PELVIS DOPPLER ULTRASOUND OF OVARIES TECHNIQUE: Both transabdominal and transvaginal ultrasound examinations of the pelvis were performed. Transabdominal technique was performed for global imaging of the pelvis including uterus, ovaries, adnexal regions, and pelvic cul-de-sac. It was necessary to proceed with endovaginal exam following the transabdominal exam to visualize the endometrium. Color and duplex Doppler ultrasound was utilized to evaluate blood flow to the ovaries. COMPARISON:  None Available. FINDINGS: Uterus Measurements: 8.1 cm x 3.6 cm x 4.6 cm = volume: 70.7 mL. No fibroids  or other mass visualized. Endometrium Thickness: 7.0 mm.  No focal abnormality visualized. Right ovary Measurements: 3.9 cm x 2.6 cm x 2.9 cm = volume: 15.5 mL. A 2.2 cm x 1.9 cm x 1.9 cm simple right ovarian cyst is seen. Left ovary Measurements: 3.1 cm x 2.0 cm x 2.3 cm = volume: 7.3 mL. Normal appearance/no adnexal mass. Pulsed Doppler evaluation demonstrates normal low-resistance arterial and venous waveforms in both ovaries. Other: There is no pelvic free fluid. IMPRESSION: 1. Simple right ovarian cyst. Electronically Signed   By: Aram Candela M.D.   On: 10/21/2022 20:13   US Transvaginal Non-OB  Result Date: 10/21/2022 CLINICAL  DATA:  Pelvic pain. EXAM: TRANSABDOMINAL AND TRANSVAGINAL ULTRASOUND OF PELVIS DOPPLER ULTRASOUND OF OVARIES TECHNIQUE: Both transabdominal and transvaginal ultrasound examinations of the pelvis were performed. Transabdominal technique was performed for global imaging of the pelvis including uterus, ovaries, adnexal regions, and pelvic cul-de-sac. It was necessary to proceed with endovaginal exam following the transabdominal exam to visualize the endometrium. Color and duplex Doppler ultrasound was utilized to evaluate blood flow to the ovaries. COMPARISON:  None Available. FINDINGS: Uterus Measurements: 8.1 cm x 3.6 cm x 4.6 cm = volume: 70.7 mL. No fibroids or other mass visualized. Endometrium Thickness: 7.0 mm.  No focal abnormality visualized. Right ovary Measurements: 3.9 cm x 2.6 cm x 2.9 cm = volume: 15.5 mL. A 2.2 cm x 1.9 cm x 1.9 cm simple right ovarian cyst is seen. Left ovary Measurements: 3.1 cm x 2.0 cm x 2.3 cm = volume: 7.3 mL. Normal appearance/no adnexal mass. Pulsed Doppler evaluation demonstrates normal low-resistance arterial and venous waveforms in both ovaries. Other: There is no pelvic free fluid. IMPRESSION: 1. Simple right ovarian cyst. Electronically Signed   By: Aram Candela M.D.   On: 10/21/2022 20:13   [place] Art/Ven Flow Abd Pelv Doppler  Result Date: 10/21/2022 CLINICAL DATA:  Pelvic pain. EXAM: TRANSABDOMINAL AND TRANSVAGINAL ULTRASOUND OF PELVIS DOPPLER ULTRASOUND OF OVARIES TECHNIQUE: Both transabdominal and transvaginal ultrasound examinations of the pelvis were performed. Transabdominal technique was performed for global imaging of the pelvis including uterus, ovaries, adnexal regions, and pelvic cul-de-sac. It was necessary to proceed with endovaginal exam following the transabdominal exam to visualize the endometrium. Color and duplex Doppler ultrasound was utilized to evaluate blood flow to the ovaries. COMPARISON:  None Available. FINDINGS: Uterus Measurements: 8.1 cm x  3.6 cm x 4.6 cm = volume: 70.7 mL. No fibroids or other mass visualized. Endometrium Thickness: 7.0 mm.  No focal abnormality visualized. Right ovary Measurements: 3.9 cm x 2.6 cm x 2.9 cm = volume: 15.5 mL. A 2.2 cm x 1.9 cm x 1.9 cm simple right ovarian cyst is seen. Left ovary Measurements: 3.1 cm x 2.0 cm x 2.3 cm = volume: 7.3 mL. Normal appearance/no adnexal mass. Pulsed Doppler evaluation demonstrates normal low-resistance arterial and venous waveforms in both ovaries. Other: There is no pelvic free fluid. IMPRESSION: 1. Simple right ovarian cyst. Electronically Signed   By: Aram Candela M.D.   On: 10/21/2022 20:13    Procedures Procedures    Medications Ordered in ED Medications  ondansetron (ZOFRAN-ODT) disintegrating tablet 4 mg (4 mg Oral Given 10/21/22 2215)  oxyCODONE-acetaminophen (PERCOCET/ROXICET) 5-325 MG per tablet 1 tablet (1 tablet Oral Given 10/21/22 2212)  iohexol (OMNIPAQUE) 350 MG/ML injection 75 mL (75 mLs Intravenous Contrast Given 10/21/22 2244)    ED Course/ Medical Decision Making/ A&P  Medical Decision Making Amount and/or Complexity of Data Reviewed Radiology: ordered.  Risk Prescription drug management.   37 year old female presenting to the emergency department with lower abdominal pain.  The patient states that she has a history of "ovarian hyperstimulation" and states that she developed pain in her bilateral lower abdomen on Friday which came on suddenly.  She states that she has a history of prior appendectomy.  She denies any vaginal discharge, vaginal bleeding.  She is not concerned for STIs.  She endorses nausea, denies any vomiting, diarrhea, denies any urinary symptoms.  She took a home pregnancy test that was negative.  No radiation of the pain to the back.  No fevers or chills.  She is tolerating oral intake.  Vitals and telemetry on arrival: On arrival, the patient was afebrile, hemodynamically stable, not tachycardic  or tachypneic, saturating well on room air.  Sinus rhythm noted on cardiac telemetry  Pertinent exam findings include: Generalized lower abdominal tenderness to palpation, no rebound or guarding  Differential Diagnosis: Ruptured ovarian cyst, ovarian torsion, less likely PID, tubo-ovarian abscess, ectopic pregnancy.  Considered, Bowel Obstruction, AAA,  Pyelonephritis, Nephrolithiasis, Pancreatitis, Cholecystitis, Shingles, Perforated Bowel or Ulcer, Diverticulosis/itis, Ischemic Mesentery, Inflammatory Bowel Disease, Strangulated/Incarcerated Hernia, gastritis, PUD.   Lab results include: hCG negative, lipase normal, CBC without a leukocytosis or anemia, urinalysis negative, no hematuria or evidence of UTI, CMP unremarkable, no electrolyte abnormality, normal renal and liver function  Imaging results include:  Pelvic ultrasound performed: IMPRESSION:  1. Simple right ovarian cyst.   CT Abdomen Pelvis: IMPRESSION:  1. Hepatic steatosis.  2. 2.5 cm right adnexal cyst, likely ovarian in origin. No follow-up  imaging is recommended. This recommendation follows ACR consensus  guidelines: White Paper of the ACR Incidental Findings Committee II  on Adnexal Findings. J Am Coll Radiol [phone].    Course of tx has consisted of: Patient offered antiemetics and pain medicine but declined, subsequently treated with Norco and Zofran.  Patient has no concern for STIs, no evidence or concern for PID.  Declines pelvic exam. Repeat evaluation and imaging reassuring.  Symptoms consistent with pain associated with with ovarian cyst.  Recommended outpatient PCP follow-up.  The patient has been appropriately medically screened and/or stabilized in the ED. I have low suspicion for any other emergent medical condition which would require further screening, evaluation or treatment in the ED or require inpatient management.   Final Clinical Impression(s) / ED Diagnoses Final diagnoses:  Lower abdominal  pain  Cyst of right ovary    Rx / DC Orders ED Discharge Orders          Ordered    Ambulatory referral to Gastroenterology  Status:  Canceled        10/21/22 2309              Ernie Avena, MD 10/21/22 2316

## 2022-10-21 NOTE — Discharge Instructions (Addendum)
Your CT imaging and ultrasound revealed evidence of an ovarian cyst which is potentially causing your discomfort.  Recommend you call your PCP for outpatient follow-up, recommend NSAIDs for pain control.  Zofran has been prescribed for nausea.

## 2022-10-21 NOTE — ED Provider Triage Note (Signed)
Emergency Medicine Provider Triage Evaluation Note  Christine Greene , a 37 y.o. female  was evaluated in triage.  Pt complains of abdominal pain.  Started on Friday all of a sudden.  Located in the lower abdomen.  Patient is status post appendectomy 4 years ago.  Denies any abnormal vaginal discharge or bleeding but mention that she recently had her period and then another period a week later.  Endorses nausea but no vomiting or diarrhea.  Denies urinary changes.  States she recently took an urine pregnancy test that was negative.  Review of Systems  Positive: See above Negative: See above  Physical Exam  BP 136/88 (BP Location: Right Arm)   Pulse 86   Temp 97.9 F (36.6 C)   Resp 19   SpO2 100%  Gen:   Awake, no distress   Resp:  Normal effort  MSK:   Moves extremities without difficulty  Other:  Right lower quadrant tenderness  Medical Decision Making  Medically screening exam initiated at 6:45 PM.  Appropriate orders placed.  Christine Greene was informed that the remainder of the evaluation will be completed by another provider, this initial triage assessment does not replace that evaluation, and the importance of remaining in the ED until their evaluation is complete.  Work up started   Christine Greene, [place] 10/21/22 1847

## 2022-10-21 NOTE — ED Triage Notes (Signed)
Pt c/o pain in lower pelvis, "like overstimulated ovaries" since Friday. Feels like "pressure, heavy cramping, bloating." Hx ectopic pregnancy, removal of one fallopian tube.  LMP 3/10, then states she had another period 3/24. Denies GI/GU symptoms.

## 2022-12-13 ENCOUNTER — Other Ambulatory Visit: Payer: Self-pay

## 2022-12-13 ENCOUNTER — Encounter (HOSPITAL_BASED_OUTPATIENT_CLINIC_OR_DEPARTMENT_OTHER): Payer: Self-pay | Admitting: Obstetrics and Gynecology

## 2022-12-13 NOTE — Progress Notes (Signed)
Spoke w/ via phone for pre-op interview---pt Lab needs dos----  none per anesthesia, surgery orders req dr Henderson Cloud epic ib           Lab results------none COVID test -----patient states asymptomatic no test needed Arrive at -------1330 pm 12-16-2022 NPO after MN NO Solid Food.  Clear liquids from MN until---1230 Med rec completed Medications to take morning of surgery -----none Diabetic medication ----- Patient instructed no nail polish to be worn day of surgery Patient instructed to bring photo id and insurance card day of surgery Patient aware to have Driver (ride ) / caregiver  husband joseph  for 24 hours after surgery  Patient Special Instructions -----none Pre-Op special Instructions -----none Patient verbalized understanding of instructions that were given at this phone interview. Patient denies shortness of breath, chest pain, fever, cough at this phone interview.

## 2022-12-15 ENCOUNTER — Other Ambulatory Visit: Payer: Self-pay | Admitting: Obstetrics and Gynecology

## 2022-12-15 DIAGNOSIS — Z01818 Encounter for other preprocedural examination: Secondary | ICD-10-CM

## 2022-12-15 NOTE — H&P (Signed)
37 y.o. [id number] with missed miscarriage.  Previously by Dr. Timothy Lasso:  "Follow up viability scan: SIUP, CRL = 0.29cm, no cardiac activity, collapsing yolk sac, normal ovaries Findings consistent with missed abortion.  We reviewed the diagnosis of missed abortion and options for management, including expectant management, medical management with misoprostol, and surgical management with a suction D&C, including risks/benefits of each. She wishes to proceed with surgical management with D&C. She has had this previously and wishes to schedule with Dr. Henderson Cloud if possible. She is Rh neg. "  Past Medical History:  Diagnosis Date   Anxiety    history of Anemia    Preterm labor    Past Surgical History:  Procedure Laterality Date   APPENDECTOMY  2008   DILATION AND CURETTAGE OF UTERUS     ECTOPIC PREGNANCY SURGERY  2007   LAPAROSCOPY FOR ECTOPIC PREGNANCY     VAGINAL DELIVERY  11/05/2011   Procedure: VAGINAL DELIVERY;  Surgeon: Levi Aland, MD;  Location: [facility];  Service: Gynecology;  Laterality: N/A;    Social History   Socioeconomic History   Marital status: Married    Spouse name: Not on file   Number of children: Not on file   Years of education: Not on file   Highest education level: Not on file  Occupational History   Not on file  Tobacco Use   Smoking status: Never   Smokeless tobacco: Never  Vaping Use   Vaping Use: Never used  Substance and Sexual Activity   Alcohol use: No   Drug use: No   Sexual activity: Not Currently    Birth control/protection: None  Other Topics Concern   Not on file  Social History Narrative   Not on file   Social Determinants of Health   Financial Resource Strain: Not on file  Food Insecurity: Not on file  Transportation Needs: Not on file  Physical Activity: Not on file  Stress: Not on file  Social Connections: Not on file  Intimate Partner Violence: Not on file    No current facility-administered medications on file prior to  encounter.   Current Outpatient Medications on File Prior to Encounter  Medication Sig Dispense Refill   escitalopram (LEXAPRO) 20 MG tablet Take 20 mg by mouth at bedtime.     Prenatal Vit-Fe Fumarate-FA (MULTIVITAMIN-PRENATAL) 27-0.8 MG TABS tablet Take 1 tablet by mouth daily at 12 noon.      No Known Allergies  Vitals:   12/13/22 0906  Weight: 77.1 kg  Height: 5' (1.524 m)    Lungs: clear to ascultation Cor:  RRR Abdomen:  soft, nontender, nondistended. Ex:  no cords, erythema Pelvic:  NEFG, closed cervix. 8 week size.  A:  For dilation and evacuation for missed miscarriage.   P: P: All risks, benefits and alternatives d/w patient and she desires to proceed.  Patient has undergone an ERAS protocol and will receive preop antibiotics and SCDs during the operation.   Pt will go home same day if eating, ambulating, voiding and pain control is good.  Loney Laurence

## 2022-12-16 ENCOUNTER — Ambulatory Visit (HOSPITAL_COMMUNITY)
Admission: RE | Admit: 2022-12-16 | Discharge: 2022-12-16 | Disposition: A | Discharge status: Home / Self Care | Payer: BC Managed Care – PPO | Source: Home / Self Care | Attending: Obstetrics and Gynecology | Admitting: Obstetrics and Gynecology

## 2022-12-16 ENCOUNTER — Ambulatory Visit (HOSPITAL_BASED_OUTPATIENT_CLINIC_OR_DEPARTMENT_OTHER)
Admission: RE | Admit: 2022-12-16 | Discharge: 2022-12-16 | Disposition: A | Discharge status: Home / Self Care | Payer: BC Managed Care – PPO | Attending: Obstetrics and Gynecology | Admitting: Obstetrics and Gynecology

## 2022-12-16 ENCOUNTER — Other Ambulatory Visit: Payer: Self-pay

## 2022-12-16 ENCOUNTER — Encounter (HOSPITAL_BASED_OUTPATIENT_CLINIC_OR_DEPARTMENT_OTHER)
Admission: RE | Disposition: A | Discharge status: Home / Self Care | Payer: Self-pay | Source: Home / Self Care | Attending: Obstetrics and Gynecology

## 2022-12-16 ENCOUNTER — Ambulatory Visit (HOSPITAL_BASED_OUTPATIENT_CLINIC_OR_DEPARTMENT_OTHER): Payer: BC Managed Care – PPO | Admitting: Anesthesiology

## 2022-12-16 ENCOUNTER — Encounter (HOSPITAL_BASED_OUTPATIENT_CLINIC_OR_DEPARTMENT_OTHER): Payer: Self-pay | Admitting: Obstetrics and Gynecology

## 2022-12-16 DIAGNOSIS — Z23 Encounter for immunization: Secondary | ICD-10-CM | POA: Insufficient documentation

## 2022-12-16 DIAGNOSIS — E669 Obesity, unspecified: Secondary | ICD-10-CM | POA: Insufficient documentation

## 2022-12-16 DIAGNOSIS — F419 Anxiety disorder, unspecified: Secondary | ICD-10-CM | POA: Diagnosis not present

## 2022-12-16 DIAGNOSIS — O021 Missed abortion: Secondary | ICD-10-CM | POA: Diagnosis present

## 2022-12-16 DIAGNOSIS — Z9889 Other specified postprocedural states: Secondary | ICD-10-CM

## 2022-12-16 DIAGNOSIS — Z6833 Body mass index (BMI) 33.0-33.9, adult: Secondary | ICD-10-CM | POA: Diagnosis not present

## 2022-12-16 DIAGNOSIS — Z01818 Encounter for other preprocedural examination: Secondary | ICD-10-CM

## 2022-12-16 DIAGNOSIS — N96 Recurrent pregnancy loss: Secondary | ICD-10-CM

## 2022-12-16 HISTORY — DX: Other specified postprocedural states: Z98.890

## 2022-12-16 HISTORY — PX: DILATION AND EVACUATION: SHX1459

## 2022-12-16 HISTORY — DX: Other specified postprocedural states: R11.2

## 2022-12-16 HISTORY — DX: Anxiety disorder, unspecified: F41.9

## 2022-12-16 HISTORY — DX: Anemia, unspecified: D64.9

## 2022-12-16 LAB — CBC
HCT: 40.7 % (ref 36.0–46.0)
Hemoglobin: 13.9 g/dL (ref 12.0–15.0)
MCH: 30.2 pg (ref 26.0–34.0)
MCHC: 34.2 g/dL (ref 30.0–36.0)
MCV: 88.3 fL (ref 80.0–100.0)
Platelets: 191 10*3/uL (ref 150–400)
RBC: 4.61 MIL/uL (ref 3.87–5.11)
RDW: 13.2 % (ref 11.5–15.5)
WBC: 6.6 10*3/uL (ref 4.0–10.5)
nRBC: 0 % (ref 0.0–0.2)

## 2022-12-16 LAB — TYPE AND SCREEN
ABO/RH(D): B NEG
Antibody Screen: NEGATIVE

## 2022-12-16 LAB — RH IG WORKUP (INCLUDES ABO/RH)
Gestational Age(Wks): 14
Unit division: 0

## 2022-12-16 SURGERY — DILATION AND EVACUATION, UTERUS
Anesthesia: General | Site: Uterus

## 2022-12-16 MED ORDER — CEFAZOLIN SODIUM-DEXTROSE 2-4 GM/100ML-% IV SOLN: 2.0000 g | INTRAVENOUS | Status: DC

## 2022-12-16 MED ORDER — SODIUM CHLORIDE 0.9 % IV SOLN: 100.0000 mg | Freq: Two times a day (BID) | INTRAVENOUS | Status: DC

## 2022-12-16 MED ORDER — DEXAMETHASONE SODIUM PHOSPHATE 10 MG/ML IJ SOLN
INTRAMUSCULAR | Status: AC
  Filled 2022-12-16: qty 1

## 2022-12-16 MED ORDER — PROMETHAZINE HCL 25 MG/ML IJ SOLN
INTRAMUSCULAR | Status: AC
  Filled 2022-12-16: qty 1

## 2022-12-16 MED ORDER — POVIDONE-IODINE 10 % EX SWAB: 2.0000 | Freq: Once | CUTANEOUS | Status: DC

## 2022-12-16 MED ORDER — FENTANYL CITRATE (PF) 100 MCG/2ML IJ SOLN
INTRAMUSCULAR | Status: AC
  Filled 2022-12-16: qty 2

## 2022-12-16 MED ORDER — ACETAMINOPHEN 500 MG PO TABS
ORAL_TABLET | ORAL | Status: AC
  Filled 2022-12-16: qty 2

## 2022-12-16 MED ORDER — SOD CITRATE-CITRIC ACID 500-334 MG/5ML PO SOLN: 30.0000 mL | ORAL | Status: DC

## 2022-12-16 MED ORDER — ESCITALOPRAM OXALATE 20 MG PO TABS: 20.0000 mg | ORAL_TABLET | Freq: Every day | ORAL | Status: DC

## 2022-12-16 MED ORDER — TRANEXAMIC ACID-NACL 1000-0.7 MG/100ML-% IV SOLN
INTRAVENOUS | Status: DC | PRN
  Administered 2022-12-16: 1000 mg via INTRAVENOUS

## 2022-12-16 MED ORDER — DEXAMETHASONE SODIUM PHOSPHATE 10 MG/ML IJ SOLN
INTRAMUSCULAR | Status: DC | PRN
  Administered 2022-12-16: 4 mg via INTRAVENOUS

## 2022-12-16 MED ORDER — ONDANSETRON HCL 4 MG/2ML IJ SOLN: 4.0000 mg | Freq: Four times a day (QID) | INTRAMUSCULAR | Status: DC | PRN

## 2022-12-16 MED ORDER — LIDOCAINE 2% (20 MG/ML) 5 ML SYRINGE
INTRAMUSCULAR | Status: DC | PRN
  Administered 2022-12-16: 60 mg via INTRAVENOUS

## 2022-12-16 MED ORDER — MIDAZOLAM HCL 5 MG/5ML IJ SOLN
INTRAMUSCULAR | Status: DC | PRN
  Administered 2022-12-16: 2 mg via INTRAVENOUS

## 2022-12-16 MED ORDER — ONDANSETRON HCL 4 MG/2ML IJ SOLN
INTRAMUSCULAR | Status: DC | PRN
  Administered 2022-12-16: 4 mg via INTRAVENOUS

## 2022-12-16 MED ORDER — OXYCODONE HCL 5 MG/5ML PO SOLN: 5.0000 mg | Freq: Once | ORAL | Status: DC | PRN

## 2022-12-16 MED ORDER — PROMETHAZINE HCL 25 MG/ML IJ SOLN
6.2500 mg | INTRAMUSCULAR | Status: DC | PRN
  Administered 2022-12-16: 6.25 mg via INTRAVENOUS

## 2022-12-16 MED ORDER — ONDANSETRON HCL 4 MG PO TABS: 4.0000 mg | ORAL_TABLET | Freq: Four times a day (QID) | ORAL | Status: DC | PRN

## 2022-12-16 MED ORDER — 0.9 % SODIUM CHLORIDE (POUR BTL) OPTIME
TOPICAL | Status: DC | PRN
  Administered 2022-12-16: 500 mL

## 2022-12-16 MED ORDER — PANTOPRAZOLE SODIUM 40 MG PO TBEC: 40.0000 mg | DELAYED_RELEASE_TABLET | Freq: Every day | ORAL | Status: DC

## 2022-12-16 MED ORDER — FENTANYL CITRATE (PF) 100 MCG/2ML IJ SOLN: 25.0000 ug | INTRAMUSCULAR | Status: DC | PRN

## 2022-12-16 MED ORDER — ONDANSETRON HCL 4 MG/2ML IJ SOLN
INTRAMUSCULAR | Status: AC
  Filled 2022-12-16: qty 2

## 2022-12-16 MED ORDER — MENTHOL 3 MG MT LOZG: 1.0000 | LOZENGE | OROMUCOSAL | Status: DC | PRN

## 2022-12-16 MED ORDER — DEXTROSE IN LACTATED RINGERS 5 % IV SOLN: INTRAVENOUS | Status: DC

## 2022-12-16 MED ORDER — KETOROLAC TROMETHAMINE 30 MG/ML IJ SOLN
INTRAMUSCULAR | Status: DC | PRN
  Administered 2022-12-16: 30 mg via INTRAVENOUS

## 2022-12-16 MED ORDER — KETOROLAC TROMETHAMINE 30 MG/ML IJ SOLN: 30.0000 mg | Freq: Once | INTRAMUSCULAR | Status: DC

## 2022-12-16 MED ORDER — RHO D IMMUNE GLOBULIN 1500 UNIT/2ML IJ SOSY
300.0000 ug | PREFILLED_SYRINGE | Freq: Once | INTRAMUSCULAR | Status: AC
  Administered 2022-12-16: 300 ug via INTRAVENOUS
  Filled 2022-12-16: qty 2

## 2022-12-16 MED ORDER — LACTATED RINGERS IV SOLN: INTRAVENOUS | Status: DC

## 2022-12-16 MED ORDER — WHITE PETROLATUM EX OINT
TOPICAL_OINTMENT | CUTANEOUS | Status: AC
  Filled 2022-12-16: qty 5

## 2022-12-16 MED ORDER — MIDAZOLAM HCL 2 MG/2ML IJ SOLN
INTRAMUSCULAR | Status: AC
  Filled 2022-12-16: qty 2

## 2022-12-16 MED ORDER — IBUPROFEN 800 MG PO TABS: 800.0000 mg | ORAL_TABLET | Freq: Three times a day (TID) | ORAL | Status: DC

## 2022-12-16 MED ORDER — ACETAMINOPHEN 500 MG PO TABS
1000.0000 mg | ORAL_TABLET | Freq: Once | ORAL | Status: AC
  Administered 2022-12-16: 1000 mg via ORAL

## 2022-12-16 MED ORDER — OXYCODONE HCL 5 MG PO TABS: 5.0000 mg | ORAL_TABLET | Freq: Once | ORAL | Status: DC | PRN

## 2022-12-16 MED ORDER — PROPOFOL 10 MG/ML IV BOLUS
INTRAVENOUS | Status: DC | PRN
  Administered 2022-12-16: 150 mg via INTRAVENOUS

## 2022-12-16 MED ORDER — METHYLERGONOVINE MALEATE 0.2 MG/ML IJ SOLN
INTRAMUSCULAR | Status: DC | PRN
  Administered 2022-12-16 (×2): .2 mg via INTRAMUSCULAR

## 2022-12-16 MED ORDER — FENTANYL CITRATE (PF) 100 MCG/2ML IJ SOLN
INTRAMUSCULAR | Status: DC | PRN
  Administered 2022-12-16 (×2): 50 ug via INTRAVENOUS

## 2022-12-16 MED ORDER — DOXYCYCLINE HYCLATE 100 MG IV SOLR
200.0000 mg | Freq: Once | INTRAVENOUS | Status: AC
  Administered 2022-12-16: 200 mg via INTRAVENOUS
  Filled 2022-12-16: qty 200

## 2022-12-16 MED ORDER — LIDOCAINE HCL (PF) 2 % IJ SOLN
INTRAMUSCULAR | Status: AC
  Filled 2022-12-16: qty 5

## 2022-12-16 MED ORDER — OXYCODONE HCL 5 MG PO TABS: 5.0000 mg | ORAL_TABLET | ORAL | Status: DC | PRN

## 2022-12-16 SURGICAL SUPPLY — 24 items
CATH ROBINSON RED A/P 16FR (CATHETERS) ×2 IMPLANT
DRSG TELFA 3X8 NADH STRL (GAUZE/BANDAGES/DRESSINGS) ×2 IMPLANT
Disposable Rigid Curette Curved 12mm IMPLANT
FILTER UTR ASPR ASSEMBLY (MISCELLANEOUS) ×2 IMPLANT
GAUZE 4X4 16PLY ~~LOC~~+RFID DBL (SPONGE) ×2 IMPLANT
GLOVE BIO SURGEON STRL SZ7 (GLOVE) ×2 IMPLANT
GLOVE BIOGEL PI IND STRL 7.0 (GLOVE) ×2 IMPLANT
GOWN STRL REUS W/TWL LRG LVL3 (GOWN DISPOSABLE) ×4 IMPLANT
HOSE CONNECTING 18IN BERKELEY (TUBING) ×2 IMPLANT
KIT BERKELEY 1ST TRIMESTER 3/8 (MISCELLANEOUS) ×4 IMPLANT
KIT TURNOVER CYSTO (KITS) ×2 IMPLANT
NS IRRIG 1000ML POUR BTL (IV SOLUTION) ×2 IMPLANT
PACK VAGINAL MINOR WOMEN LF (CUSTOM PROCEDURE TRAY) ×2 IMPLANT
PAD OB MATERNITY 4.3X12.25 (PERSONAL CARE ITEMS) ×2 IMPLANT
PAD PREP 24X48 CUFFED NSTRL (MISCELLANEOUS) ×2 IMPLANT
SET BERKELEY SUCTION TUBING (SUCTIONS) ×2 IMPLANT
SLEEVE SCD COMPRESS KNEE MED (STOCKING) ×2 IMPLANT
SPIKE FLUID TRANSFER (MISCELLANEOUS) ×2 IMPLANT
TOWEL OR 17X24 6PK STRL BLUE (TOWEL DISPOSABLE) ×2 IMPLANT
VACURETTE 10 RIGID CVD (CANNULA) IMPLANT
VACURETTE 12 RIGID CVD (CANNULA) IMPLANT
VACURETTE 7MM CVD STRL WRAP (CANNULA) IMPLANT
VACURETTE 8 RIGID CVD (CANNULA) IMPLANT
VACURETTE 9 RIGID CVD (CANNULA) IMPLANT

## 2022-12-16 NOTE — Progress Notes (Signed)
There has been no change in the patients history, status or exam since the history and physical.  Vitals:   12/13/22 0906 12/16/22 1359  BP:  133/76  Pulse:  81  Resp:  17  Temp:  98.1 F (36.7 C)  TempSrc:  Oral  SpO2:  98%  Weight: 77.1 kg 77.6 kg  Height: 5' (1.524 m) 5' (1.524 m)    Results for orders placed or performed during the hospital encounter of 12/16/22 (from the past 72 hour(s))  CBC     Status: None   Collection Time: 12/16/22  1:55 PM  Result Value Ref Range   WBC 6.6 4.0 - 10.5 K/uL   RBC 4.61 3.87 - 5.11 MIL/uL   Hemoglobin 13.9 12.0 - 15.0 g/dL   HCT 29.5 62.1 - 30.8 %   MCV 88.3 80.0 - 100.0 fL   MCH 30.2 26.0 - 34.0 pg   MCHC 34.2 30.0 - 36.0 g/dL   RDW 65.7 84.6 - 96.2 %   Platelets 191 150 - 400 K/uL   nRBC 0.0 0.0 - 0.2 %    Comment: Performed at [facility], [address], [place], [place] [address]  Type and screen     Status: None   Collection Time: 12/16/22  1:55 PM  Result Value Ref Range   ABO/RH(D) B NEG    Antibody Screen NEG    Sample Expiration      12/19/2022,2359 Performed at [facility], [address], [place], [place] [address]    Bedside US confirms MAB today- now not seeing FP at all but likely due to inevitable miscarriage.  Pt desires anora.  Christine Greene

## 2022-12-16 NOTE — Brief Op Note (Signed)
12/16/2022  4:18 PM  PATIENT:  Christine Greene  37 y.o. female  PRE-OPERATIVE DIAGNOSIS:  MISSED AB  POST-OPERATIVE DIAGNOSIS:  MISSED AB  PROCEDURE:  Procedure(s): DILATATION AND EVACUATION (N/A) CHROMOSOME STUDIES (N/A)  SURGEON:  Surgeon(s) and Role:    * Carrington Clamp, MD - Primary  ANESTHESIA:   general  EBL:  300 mL   DRAINS: none   LOCAL MEDICATIONS USED:  NONE  SPECIMEN:  Source of Specimen:  uterine contents  DISPOSITION OF SPECIMEN:  PATHOLOGY and Anora  COUNTS:  YES  TOURNIQUET:  * No tourniquets in log *  DICTATION: .Note written in EPIC  PLAN OF CARE: Discharge to home after PACU  PATIENT DISPOSITION:  PACU - hemodynamically stable.   Delay start of Pharmacological VTE agent (>24hrs) due to surgical blood loss or risk of bleeding: not applicable

## 2022-12-16 NOTE — Transfer of Care (Signed)
Immediate Anesthesia Transfer of Care Note  Patient: Christine Greene  Procedure(s) Performed: DILATATION AND EVACUATION CHROMOSOME STUDIES (Uterus)  Patient Location: PACU  Anesthesia Type:General  Level of Consciousness: drowsy  Airway & Oxygen Therapy: Patient Spontanous Breathing and Patient connected to nasal cannula oxygen  Post-op Assessment: Report given to RN  Post vital signs: Reviewed and stable  Last Vitals:  Vitals Value Taken Time  BP 120/73 12/16/22 1627  Temp 37 C 12/16/22 1627  Pulse 79 12/16/22 1627  Resp 11 12/16/22 1627  SpO2 100 % 12/16/22 1627  Vitals shown include unvalidated device data.  Last Pain:  Vitals:   12/16/22 1359  TempSrc: Oral  PainSc: 0-No pain      Patients Stated Pain Goal: 4 (12/16/22 1359)  Complications: No notable events documented.

## 2022-12-16 NOTE — Anesthesia Procedure Notes (Signed)
Procedure Name: LMA Insertion Date/Time: 12/16/2022 3:46 PM  Performed by: Briant Sites, CRNAPre-anesthesia Checklist: Patient identified, Emergency Drugs available, Suction available and Patient being monitored Patient Re-evaluated:Patient Re-evaluated prior to induction Oxygen Delivery Method: Circle system utilized Preoxygenation: Pre-oxygenation with 100% oxygen Induction Type: IV induction Ventilation: Mask ventilation without difficulty LMA: LMA inserted LMA Size: 4.0 Number of attempts: 1 Airway Equipment and Method: Bite block Placement Confirmation: positive ETCO2 Tube secured with: Tape Dental Injury: Teeth and Oropharynx as per pre-operative assessment

## 2022-12-16 NOTE — Progress Notes (Signed)
Spoke with Dr Henderson Cloud patient was concerned about bleeding.  Patient having spotting it was no more than she had earlier.  Patient will be discharged home she has Dr Kittie Plater number with concerns.

## 2022-12-16 NOTE — Progress Notes (Signed)
Dr Henderson Cloud came to evaluate patient for discharge.  She is stable and ready to be discharged after Rhogam.

## 2022-12-16 NOTE — Anesthesia Preprocedure Evaluation (Addendum)
Anesthesia Evaluation  Patient identified by MRN, date of birth, ID band Patient awake    Reviewed: Allergy & Precautions, NPO status , Patient's Chart, lab work & pertinent test results  History of Anesthesia Complications (+) PONV and history of anesthetic complications  Airway Mallampati: II  TM Distance: >3 FB Neck ROM: Full    Dental   Pulmonary neg pulmonary ROS   breath sounds clear to auscultation       Cardiovascular negative cardio ROS  Rhythm:Regular Rate:Normal     Neuro/Psych  PSYCHIATRIC DISORDERS Anxiety     negative neurological ROS     GI/Hepatic negative GI ROS, Neg liver ROS,,,  Endo/Other   Obesity   Renal/GU negative Renal ROS     Musculoskeletal negative musculoskeletal ROS (+)    Abdominal   Peds  Hematology negative hematology ROS (+)   Anesthesia Other Findings   Reproductive/Obstetrics  Missed abortion Hx PIH                              Anesthesia Physical Anesthesia Plan  ASA: 2  Anesthesia Plan: General   Post-op Pain Management: Tylenol PO (pre-op)*   Induction: Intravenous  PONV Risk Score and Plan: 4 or greater and Treatment may vary due to age or medical condition, Ondansetron, Dexamethasone, Midazolam, Propofol infusion and TIVA  Airway Management Planned: LMA  Additional Equipment: None  Intra-op Plan:   Post-operative Plan: Extubation in OR  Informed Consent: I have reviewed the patients History and Physical, chart, labs and discussed the procedure including the risks, benefits and alternatives for the proposed anesthesia with the patient or authorized representative who has indicated his/her understanding and acceptance.     Dental advisory given  Plan Discussed with: CRNA and Anesthesiologist  Anesthesia Plan Comments:        Anesthesia Quick Evaluation

## 2022-12-16 NOTE — Discharge Instructions (Addendum)
  Post Anesthesia Home Care Instructions  Activity: Get plenty of rest for the remainder of the day. A responsible individual must stay with you for 24 hours following the procedure.  For the next 24 hours, DO NOT: -Drive a car -Advertising copywriter -Drink alcoholic beverages -Take any medication unless instructed by your physician -Make any legal decisions or sign important papers.  Meals: Start with liquid foods such as gelatin or soup. Progress to regular foods as tolerated. Avoid greasy, spicy, heavy foods. If nausea and/or vomiting occur, drink only clear liquids until the nausea and/or vomiting subsides. Call your physician if vomiting continues.  Special Instructions/Symptoms: Your throat may feel dry or sore from the anesthesia or the breathing tube placed in your throat during surgery. If this causes discomfort, gargle with warm salt water. The discomfort should disappear within 24 hours.    D & C Home care Instructions:   Personal hygiene:  Used sanitary napkins for vaginal drainage not tampons. Shower or tub bathe the day after your procedure. No douching until bleeding stops. Always wipe from front to back after  Elimination.  Activity: Do not drive or operate any equipment today. The effects of the anesthesia are still present and drowsiness may result. Rest today, not necessarily flat bed rest, just take it easy. You may resume your normal activity in one to 2 days.  Sexual activity: No intercourse for one week or as indicated by your physician  Diet: Eat a light diet as desired this evening. You may resume a regular diet tomorrow.  Return to work: One to 2 days.  General Expectations of your surgery: Vaginal bleeding should be no heavier than a normal period. Spotting may continue up to 10 days. Mild cramps may continue for a couple of days. You may have a regular period in 2-6 weeks.  Unexpected observations call your doctor if these occur: persistent or heavy bleeding.  Severe abdominal cramping or pain. Elevation of temperature greater than 100F.  Call for an appointment in one week.  May take Tylenol as needed beginning at 8 PM for cramping/discomfort. Do not take Ibuprofen before 12 Midnight if needed.

## 2022-12-16 NOTE — Op Note (Signed)
12/16/2022  4:18 PM  PATIENT:  Christine Greene  37 y.o. female  PRE-OPERATIVE DIAGNOSIS:  MISSED AB  POST-OPERATIVE DIAGNOSIS:  MISSED AB  PROCEDURE:  Procedure(s): DILATATION AND EVACUATION (N/Greene) CHROMOSOME STUDIES (N/Greene)  SURGEON:  Surgeon(s) and Role:    * Carrington Clamp, MD - Primary  ANESTHESIA:   general  EBL:  300 mL   DRAINS: none   LOCAL MEDICATIONS USED:  NONE  SPECIMEN:  Source of Specimen:  uterine contents  DISPOSITION OF SPECIMEN:  PATHOLOGY and Anora  COUNTS:  YES  TOURNIQUET:  * No tourniquets in log *  DICTATION: .Note written in EPIC  PLAN OF CARE: Discharge to home after PACU  PATIENT DISPOSITION:  PACU - hemodynamically stable.   Delay start of Pharmacological VTE agent (>24hrs) due to surgical blood loss or risk of bleeding: not applicable Medications: Methergine  Complications: None  Findings:  8 week size uterus to 7 size post procedure.  Good crie was achieved.  After adequate anesthesia was achieved, the patient was prepped and draped in the usual sterile fashion.  The speculum was placed in the vagina and the cervix stabilized with Greene single-tooth tenaculum.  The cervix was dilated with Shawnie Pons dilators and the 12 mm curette was used to remove contents of the uterus.  Alternating sharp curettage with Greene curette and suction curettage was performed until all contents were removed and good crie was achieved.   Bleeding was still about like Greene moderate period, bright red.  After massage of uterus with pressure, another go with the suction was done and the currette was used to confirm intact uterus.  Methergine x2 was given as well as transxemic acid.  Bleeding slowed Greene little and the pt was deemed stable enough to go to PACU. All instruments were removed from the vagina.  The patient tolerated the procedure well.  She will have extended recovery to make sure bleeding is normal.   Christine Greene

## 2022-12-16 NOTE — Anesthesia Postprocedure Evaluation (Signed)
Anesthesia Post Note  Patient: ALANTE REATEGUI  Procedure(s) Performed: DILATATION AND EVACUATION CHROMOSOME STUDIES (Uterus)     Patient location during evaluation: PACU Anesthesia Type: General Level of consciousness: awake and alert Pain management: pain level controlled Vital Signs Assessment: post-procedure vital signs reviewed and stable Respiratory status: spontaneous breathing, nonlabored ventilation and respiratory function stable Cardiovascular status: blood pressure returned to baseline and stable Postop Assessment: no apparent nausea or vomiting Anesthetic complications: no  No notable events documented.  Last Vitals:  Vitals:   12/16/22 1700 12/16/22 1715  BP: 125/71 107/72  Pulse: 73 63  Resp: 11 13  Temp: 37.1 C 37.1 C  SpO2: 99% 100%    Last Pain:  Vitals:   12/16/22 1715  TempSrc:   PainSc: 1                  Donnalyn Juran,W. EDMOND

## 2022-12-16 NOTE — Progress Notes (Signed)
Bleeding is stable but not excessive.  Will do extended recovery to make sure bleeding slows before d/c.

## 2022-12-17 ENCOUNTER — Other Ambulatory Visit: Payer: Self-pay | Admitting: Obstetrics and Gynecology

## 2022-12-17 ENCOUNTER — Other Ambulatory Visit: Payer: Self-pay

## 2022-12-17 ENCOUNTER — Ambulatory Visit (HOSPITAL_COMMUNITY)
Admission: EM | Admit: 2022-12-17 | Discharge: 2022-12-18 | Disposition: A | Discharge status: Home / Self Care | Payer: BC Managed Care – PPO | Attending: Obstetrics and Gynecology | Admitting: Obstetrics and Gynecology

## 2022-12-17 ENCOUNTER — Encounter (HOSPITAL_BASED_OUTPATIENT_CLINIC_OR_DEPARTMENT_OTHER): Payer: Self-pay | Admitting: Obstetrics and Gynecology

## 2022-12-17 ENCOUNTER — Emergency Department (HOSPITAL_COMMUNITY): Payer: BC Managed Care – PPO

## 2022-12-17 DIAGNOSIS — Z79899 Other long term (current) drug therapy: Secondary | ICD-10-CM | POA: Insufficient documentation

## 2022-12-17 DIAGNOSIS — R509 Fever, unspecified: Secondary | ICD-10-CM

## 2022-12-17 DIAGNOSIS — Z9889 Other specified postprocedural states: Secondary | ICD-10-CM

## 2022-12-17 DIAGNOSIS — F419 Anxiety disorder, unspecified: Secondary | ICD-10-CM | POA: Diagnosis not present

## 2022-12-17 DIAGNOSIS — O034 Incomplete spontaneous abortion without complication: Secondary | ICD-10-CM | POA: Diagnosis present

## 2022-12-17 DIAGNOSIS — Z01818 Encounter for other preprocedural examination: Secondary | ICD-10-CM

## 2022-12-17 HISTORY — DX: Incomplete spontaneous abortion without complication: O03.4

## 2022-12-17 LAB — COMPREHENSIVE METABOLIC PANEL
ALT: 28 U/L (ref 0–44)
AST: 23 U/L (ref 15–41)
Albumin: 4 g/dL (ref 3.5–5.0)
Alkaline Phosphatase: 59 U/L (ref 38–126)
Anion gap: 9 (ref 5–15)
BUN: 11 mg/dL (ref 6–20)
CO2: 23 mmol/L (ref 22–32)
Calcium: 9.3 mg/dL (ref 8.9–10.3)
Chloride: 106 mmol/L (ref 98–111)
Creatinine, Ser: 0.6 mg/dL (ref 0.44–1.00)
GFR, Estimated: >60 mL/min (ref >60)
Glucose, Bld: 90 mg/dL (ref 70–99)
Potassium: 3.2 mmol/L — ABNORMAL LOW (ref 3.5–5.1)
Sodium: 138 mmol/L (ref 135–145)
Total Bilirubin: 0.3 mg/dL (ref 0.3–1.2)
Total Protein: 6.9 g/dL (ref 6.5–8.1)

## 2022-12-17 LAB — CBC WITH DIFFERENTIAL/PLATELET
Abs Immature Granulocytes: 0.08 10*3/uL — ABNORMAL HIGH (ref 0.00–0.07)
Basophils Absolute: 0 10*3/uL (ref 0.0–0.1)
Basophils Relative: 0 %
Eosinophils Absolute: 0.1 10*3/uL (ref 0.0–0.5)
Eosinophils Relative: 1 %
HCT: 36.8 % (ref 36.0–46.0)
Hemoglobin: 12.3 g/dL (ref 12.0–15.0)
Immature Granulocytes: 1 %
Lymphocytes Relative: 16 %
Lymphs Abs: 1.9 10*3/uL (ref 0.7–4.0)
MCH: 29.9 pg (ref 26.0–34.0)
MCHC: 33.4 g/dL (ref 30.0–36.0)
MCV: 89.3 fL (ref 80.0–100.0)
Monocytes Absolute: 0.9 10*3/uL (ref 0.1–1.0)
Monocytes Relative: 8 %
Neutro Abs: 8.8 10*3/uL — ABNORMAL HIGH (ref 1.7–7.7)
Neutrophils Relative %: 74 %
Platelets: 232 10*3/uL (ref 150–400)
RBC: 4.12 MIL/uL (ref 3.87–5.11)
RDW: 13.2 % (ref 11.5–15.5)
WBC: 11.8 10*3/uL — ABNORMAL HIGH (ref 4.0–10.5)
nRBC: 0 % (ref 0.0–0.2)

## 2022-12-17 LAB — URINALYSIS, ROUTINE W REFLEX MICROSCOPIC
Bilirubin Urine: NEGATIVE
Glucose, UA: NEGATIVE mg/dL
Ketones, ur: NEGATIVE mg/dL
Nitrite: NEGATIVE
Protein, ur: 30 mg/dL — AB
Specific Gravity, Urine: 1.026 (ref 1.005–1.030)
pH: 5 (ref 5.0–8.0)

## 2022-12-17 LAB — RH IG WORKUP (INCLUDES ABO/RH): Unit tag comment: 14

## 2022-12-17 MED ORDER — ONDANSETRON HCL 4 MG/2ML IJ SOLN
4.0000 mg | Freq: Four times a day (QID) | INTRAMUSCULAR | Status: DC | PRN
  Administered 2022-12-17 – 2022-12-18 (×3): 4 mg via INTRAVENOUS
  Filled 2022-12-17 (×2): qty 2

## 2022-12-17 MED ORDER — HYDROMORPHONE HCL 1 MG/ML IJ SOLN
1.0000 mg | Freq: Once | INTRAMUSCULAR | Status: AC
  Administered 2022-12-17: 1 mg via INTRAVENOUS
  Filled 2022-12-17: qty 1

## 2022-12-17 MED ORDER — ONDANSETRON HCL 4 MG/2ML IJ SOLN
4.0000 mg | Freq: Once | INTRAMUSCULAR | Status: AC
  Administered 2022-12-17: 4 mg via INTRAVENOUS
  Filled 2022-12-17: qty 2

## 2022-12-17 MED ORDER — HYDROMORPHONE HCL 1 MG/ML IJ SOLN
0.2000 mg | INTRAMUSCULAR | Status: DC | PRN
  Administered 2022-12-17 – 2022-12-18 (×4): 0.6 mg via INTRAVENOUS
  Filled 2022-12-17 (×4): qty 1

## 2022-12-17 MED ORDER — LACTATED RINGERS IV SOLN: INTRAVENOUS | Status: DC

## 2022-12-17 MED ORDER — SODIUM CHLORIDE 0.9 % IV BOLUS
1000.0000 mL | Freq: Once | INTRAVENOUS | Status: AC
  Administered 2022-12-17: 1000 mL via INTRAVENOUS

## 2022-12-17 MED ORDER — ONDANSETRON HCL 4 MG PO TABS: 4.0000 mg | ORAL_TABLET | Freq: Four times a day (QID) | ORAL | Status: DC | PRN

## 2022-12-17 MED ORDER — TRANEXAMIC ACID-NACL 1000-0.7 MG/100ML-% IV SOLN
1000.0000 mg | Freq: Once | INTRAVENOUS | Status: AC
  Administered 2022-12-18: 1000 mg via INTRAVENOUS
  Filled 2022-12-17: qty 100

## 2022-12-17 MED ORDER — MORPHINE SULFATE (PF) 2 MG/ML IV SOLN
2.0000 mg | Freq: Once | INTRAVENOUS | Status: AC
  Administered 2022-12-17: 2 mg via INTRAVENOUS
  Filled 2022-12-17: qty 1

## 2022-12-17 MED ORDER — SODIUM CHLORIDE 0.9 % IV SOLN
1.0000 g | Freq: Once | INTRAVENOUS | Status: AC
  Administered 2022-12-17: 1 g via INTRAVENOUS
  Filled 2022-12-17: qty 10

## 2022-12-17 MED ORDER — POVIDONE-IODINE 10 % EX SWAB: 2.0000 | Freq: Once | CUTANEOUS | Status: DC

## 2022-12-17 MED ORDER — SOD CITRATE-CITRIC ACID 500-334 MG/5ML PO SOLN
30.0000 mL | ORAL | Status: AC
  Administered 2022-12-18: 30 mL via ORAL
  Filled 2022-12-17: qty 30

## 2022-12-17 MED ORDER — DEXTROSE 5 % IV SOLN: 200.0000 mg | Freq: Two times a day (BID) | INTRAVENOUS | Status: DC

## 2022-12-17 NOTE — ED Notes (Signed)
Surgeon called and states that she wants to be called instead of the on call. Christine Greene, Christine Greene [phone]

## 2022-12-17 NOTE — H&P (Signed)
37 y.o. [id number] complains of pain and fever one day post op D&E for miscarriage.  FEver 101 tonight,.  Bleeding yesterday late and today was not more than expected but her abdomen  is hurting now.  Last ate small supper at 5 pm.  US shows retained products, about 3 cm without any other abnormalities or fluid in pelvis.  .  Past Medical History:  Diagnosis Date   Anxiety    history of Anemia    PONV (postoperative nausea and vomiting)    Preterm labor    Past Surgical History:  Procedure Laterality Date   APPENDECTOMY  2008   DILATION AND CURETTAGE OF UTERUS     DILATION AND EVACUATION N/A 12/16/2022   Procedure: DILATATION AND EVACUATION;  Surgeon: Carrington Clamp, MD;  Location: [facility] ;  Service: Gynecology;  Laterality: N/A;   ECTOPIC PREGNANCY SURGERY  2007   LAPAROSCOPY FOR ECTOPIC PREGNANCY     VAGINAL DELIVERY  11/05/2011   Procedure: VAGINAL DELIVERY;  Surgeon: Levi Aland, MD;  Location: [facility];  Service: Gynecology;  Laterality: N/A;    Social History   Socioeconomic History   Marital status: Married    Spouse name: Not on file   Number of children: Not on file   Years of education: Not on file   Highest education level: Not on file  Occupational History   Not on file  Tobacco Use   Smoking status: Never   Smokeless tobacco: Never  Vaping Use   Vaping Use: Never used  Substance and Sexual Activity   Alcohol use: No   Drug use: No   Sexual activity: Not Currently    Birth control/protection: None  Other Topics Concern   Not on file  Social History Narrative   Not on file   Social Determinants of Health   Financial Resource Strain: Not on file  Food Insecurity: Not on file  Transportation Needs: Not on file  Physical Activity: Not on file  Stress: Not on file  Social Connections: Not on file  Intimate Partner Violence: Not on file    No current facility-administered medications on file prior to encounter.   Current Outpatient  Medications on File Prior to Encounter  Medication Sig Dispense Refill   escitalopram (LEXAPRO) 20 MG tablet Take 20 mg by mouth at bedtime.     Prenatal Vit-Fe Fumarate-FA (MULTIVITAMIN-PRENATAL) 27-0.8 MG TABS tablet Take 1 tablet by mouth daily at 12 noon.      No Known Allergies  Vitals:   12/17/22 1923 12/17/22 1945 12/17/22 2015 12/17/22 2140  BP:  108/69 133/80 120/63  Pulse:  76 86 97  Resp:  18 16   Temp:      TempSrc:      SpO2:  98% 98% 98%  Weight: 77.6 kg     Height: 5' (1.524 m)       Lungs: clear to ascultation Cor:  RRR Abdomen:  soft, tender, nondistended. Ex:  no cords, erythema Pelvic:  deferred to OR.  A:  Retained products after D&E.  Has already received dose of Ceftrax but will also give additional doxy with OR.  Repeat d&e planned for 1 am.     P: P: All risks, benefits and alternatives d/w patient and she desires to proceed.  Patient has undergone an ERAS protocol and will receive preop antibiotics and SCDs during the operation.   Pt to have extended recovery but will go home if eating, ambulating, voiding and pain control  is good.  Will send home with 7 days of po antibiotics.  Loney Laurence

## 2022-12-17 NOTE — ED Triage Notes (Signed)
Pt had d&c at the [place] [facility] yesterday. Presents today with post operative pain and period - like bleeding. Also states she developed a fever around 1700. Took tylenol and ibuprofen at home.

## 2022-12-17 NOTE — ED Provider Notes (Signed)
[place] EMERGENCY DEPARTMENT AT [facility] Provider Note   CSN: [id number] Arrival date & time: 12/17/22  1911     History  Chief Complaint  Patient presents with   Post-op Problem    Post operative pain    Christine Greene is a 37 y.o. female, status post D&C by Dr. Henderson Cloud on 6/17, who presents to the ED secondary to severe uterine pain, and fever of 101 F.  She states she had the D&C yesterday, and it went pretty uneventfully, is a except for a [place] amount of bleeding, which resolved.  Her bleeding is in control now, but her pelvic pain is severe and she developed a fever of 101 and just feels ill and unwell.  Denies any kind of nausea, vomiting.  Has not had any trauma to the area other than the D&C.     Home Medications Prior to Admission medications   Medication Sig Start Date End Date Taking? Authorizing Provider  escitalopram (LEXAPRO) 20 MG tablet Take 20 mg by mouth at bedtime.   Yes [provider]  Prenatal Vit-Fe Fumarate-FA (MULTIVITAMIN-PRENATAL) 27-0.8 MG TABS tablet Take 1 tablet by mouth daily at 12 noon.   Yes [provider]      Allergies    Patient has no known allergies.    Review of Systems   Review of Systems  Constitutional:  Positive for fever.  Gastrointestinal:  Positive for abdominal pain.  Genitourinary:  Negative for hematuria.    Physical Exam Updated Vital Signs BP (!) 95/55   Pulse 72   Temp 98.8 F (37.1 C) (Oral)   Resp 20   Ht 5' (1.524 m)   Wt 77.6 kg   LMP 09/22/2022   SpO2 98%   BMI 33.40 kg/m  Physical Exam Vitals and nursing note reviewed.  Constitutional:      General: She is not in acute distress.    Appearance: She is well-developed.  HENT:     Head: Normocephalic and atraumatic.  Eyes:     Conjunctiva/sclera: Conjunctivae normal.  Cardiovascular:     Rate and Rhythm: Normal rate and regular rhythm.     Heart sounds: No murmur heard. Pulmonary:     Effort: Pulmonary effort  is normal. No respiratory distress.     Breath sounds: Normal breath sounds.  Abdominal:     Palpations: Abdomen is soft.     Tenderness: There is abdominal tenderness in the suprapubic area.  Musculoskeletal:        General: No swelling.     Cervical back: Neck supple.  Skin:    General: Skin is warm and dry.     Capillary Refill: Capillary refill takes less than 2 seconds.  Neurological:     Mental Status: She is alert.  Psychiatric:        Mood and Affect: Mood normal.     ED Results / Procedures / Treatments   Labs (all labs ordered are listed, but only abnormal results are displayed) Labs Reviewed  CBC WITH DIFFERENTIAL/PLATELET - Abnormal; Notable for the following components:      Result Value   WBC 11.8 (*)    Neutro Abs 8.8 (*)    Abs Immature Granulocytes 0.08 (*)    All other components within normal limits  COMPREHENSIVE METABOLIC PANEL - Abnormal; Notable for the following components:   Potassium 3.2 (*)    All other components within normal limits    EKG None  Radiology US Pelvis Complete  Result Date: 12/17/2022 CLINICAL DATA:  Fever assess for retained products EXAM: TRANSABDOMINAL ULTRASOUND OF PELVIS TECHNIQUE: Transabdominal ultrasound examination of the pelvis was performed including evaluation of the uterus, ovaries, adnexal regions, and pelvic cul-de-sac. COMPARISON:  Ob ultrasound 12/16/2022 FINDINGS: Uterus Measurements: 11.5 x 5.4 x 6.4 cm = volume: 209.4 mL. No fibroids or other mass visualized. Endometrium Thickness: 30.8 mm. Complex mass containing cystic areas at the fundal uterus measuring 2.1 x 1.9 x 3.1 cm. No increased vascularity. Right ovary Measurements: 3.3 x 2.6 x 3 cm = volume: 13 mL. Normal appearance/no adnexal mass. Left ovary Measurements: 2.7 x 2.1 x 2.3 cm = volume: 6.7 mL. Normal appearance/no adnexal mass. Other findings:  No abnormal free fluid. IMPRESSION: 3.1 cm complex endometrial mass. Sonographic findings suspicious for  retained products of conception in the correct clinical setting. Electronically Signed   By: Jasmine Pang M.D.   On: 12/17/2022 21:00   US OB Comp Less 14 Wks  Result Date: 12/16/2022 CLINICAL DATA:  Confirmation of fetal demise EXAM: OBSTETRIC <14 WK ULTRASOUND TECHNIQUE: Transabdominal ultrasound was performed for evaluation of the gestation as well as the maternal uterus and adnexal regions. COMPARISON:  Previous pelvic sonogram done on 10/21/2022 FINDINGS: Intrauterine gestational sac: Single Yolk sac:  Not seen Embryo:  Not seen Cardiac Activity: Not seen MSD:  20.8 mm   7 w   2 d Subchorionic hemorrhage:  None visualized. Maternal uterus/adnexae: Unremarkable. There is no free fluid in pelvis. IMPRESSION: There is fluid collection, possibly gestational sac within the uterus without yolk sac or fetal pole or fetal cardiac activity. Findings suggest failed gestation with incomplete abortion. By mean sac diameter, estimated gestational age is 7 weeks 2 days. There are no adnexal masses.  There is no free fluid in pelvis. Electronically Signed   By: Ernie Avena M.D.   On: 12/16/2022 16:25    Procedures Procedures    Medications Ordered in ED Medications  morphine (PF) 2 MG/ML injection 2 mg (2 mg Intravenous Given 12/17/22 2011)  sodium chloride 0.9 % bolus 1,000 mL (0 mLs Intravenous Stopped 12/17/22 2110)  ondansetron (ZOFRAN) injection 4 mg (4 mg Intravenous Given 12/17/22 2019)  cefTRIAXone (ROCEPHIN) 1 g in sodium chloride 0.9 % 100 mL IVPB (0 g Intravenous Stopped 12/17/22 2158)  HYDROmorphone (DILAUDID) injection 1 mg (1 mg Intravenous Given 12/17/22 2123)  ondansetron (ZOFRAN) injection 4 mg (4 mg Intravenous Given 12/17/22 2130)    ED Course/ Medical Decision Making/ A&P                             Medical Decision Making Patient is a 37 year old female, here for fever, and pelvic pain after getting D&C yesterday, for missed miscarriage.  She is acutely tender to her pelvis,  and fever was 101 prior to arrival.  We will obtain ultrasound, as well as labs for further evaluation.  I am suspicious for possible retained products of conception.  Amount and/or Complexity of Data Reviewed Labs: ordered.    Details: Leukocytosis of 11.8K Radiology: ordered.    Details: 3.1 cm complex endometrial mass, on ultrasound Discussion of management or test interpretation with external provider(s): Discussed with patient, she has retained products of conception, I spoke with OB/GYN on-call, they recommended that I speak to Dr. Henderson Cloud, spoke with Dr. Henderson Cloud, after call back, and informed her no concern for retained products of conception, I have already started on ceftriaxone.  Dr. Henderson Cloud will  further evaluate patient and admit.  Will likely require further D&C.  Patient in agreement with Greene.  Risk Prescription drug management. Decision regarding hospitalization.    Final Clinical Impression(s) / ED Diagnoses Final diagnoses:  Retained products of conception after miscarriage  Fever, unspecified fever cause    Rx / DC Orders ED Discharge Orders     None         Christine Greene, Christine Alto, Christine Greene 12/17/22 2354    Christine Plan, DO 12/18/22 1458

## 2022-12-17 NOTE — ED Notes (Signed)
ED TO INPATIENT HANDOFF REPORT  ED Nurse Name and Phone #: Claiborne Rigg Name/Age/Gender Olean Ree 37 y.o. female Room/Bed: WA14/WA14  Code Status   Code Status: Full Code  Home/SNF/Other Home Patient oriented to: self, place, time, and situation Is this baseline? Yes   Triage Complete: Triage complete  Chief Complaint Retained products of conception after miscarriage [O03.4]  Triage Note Pt had d&c at the [facility] yesterday. Presents today with post operative pain and period - like bleeding. Also states she developed a fever around 1700. Took tylenol and ibuprofen at home.   Allergies No Known Allergies  Level of Care/Admitting Diagnosis ED Disposition     ED Disposition  Admit   Condition  --   Comment  Hospital Area: [facility] [[id number]]  Level of Care: Med-Surg [16]  May place patient in observation at [facility] or [facility] if equivalent level of care is available:: No  Covid Evaluation: Asymptomatic - no recent exposure (last 10 days) testing not required  Diagnosis: Retained products of conception after miscarriage [1610960]  Admitting Physician: Carrington Clamp [[id number]]  Attending Physician: Aundria Rud          B Medical/Surgery History Past Medical History:  Diagnosis Date   Anxiety    history of Anemia    PONV (postoperative nausea and vomiting)    Preterm labor    Past Surgical History:  Procedure Laterality Date   APPENDECTOMY  2008   DILATION AND CURETTAGE OF UTERUS     DILATION AND EVACUATION N/A 12/16/2022   Procedure: DILATATION AND EVACUATION;  Surgeon: Carrington Clamp, MD;  Location: [facility];  Service: Gynecology;  Laterality: N/A;   ECTOPIC PREGNANCY SURGERY  2007   LAPAROSCOPY FOR ECTOPIC PREGNANCY     VAGINAL DELIVERY  11/05/2011   Procedure: VAGINAL DELIVERY;  Surgeon: Levi Aland, MD;  Location: [facility];  Service: Gynecology;  Laterality:  N/A;     A IV Location/Drains/Wounds Patient Lines/Drains/Airways Status     Active Line/Drains/Airways     Name Placement date Placement time Site Days   Peripheral IV 12/17/22 20 G 1" Left Antecubital 12/17/22  1935  Antecubital  less than 1            Intake/Output Last 24 hours  Intake/Output Summary (Last 24 hours) at 12/17/2022 2225 Last data filed at 12/17/2022 2158 Gross per 24 hour  Intake 1100 ml  Output --  Net 1100 ml    Labs/Imaging Results for orders placed or performed during the hospital encounter of 12/17/22 (from the past 48 hour(s))  CBC with Differential     Status: Abnormal   Collection Time: 12/17/22  7:35 PM  Result Value Ref Range   WBC 11.8 (H) 4.0 - 10.5 K/uL   RBC 4.12 3.87 - 5.11 MIL/uL   Hemoglobin 12.3 12.0 - 15.0 g/dL   HCT 45.4 09.8 - 11.9 %   MCV 89.3 80.0 - 100.0 fL   MCH 29.9 26.0 - 34.0 pg   MCHC 33.4 30.0 - 36.0 g/dL   RDW 14.7 82.9 - 56.2 %   Platelets 232 150 - 400 K/uL   nRBC 0.0 0.0 - 0.2 %   Neutrophils Relative % 74 %   Neutro Abs 8.8 (H) 1.7 - 7.7 K/uL   Lymphocytes Relative 16 %   Lymphs Abs 1.9 0.7 - 4.0 K/uL   Monocytes Relative 8 %   Monocytes Absolute 0.9 0.1 - 1.0  K/uL   Eosinophils Relative 1 %   Eosinophils Absolute 0.1 0.0 - 0.5 K/uL   Basophils Relative 0 %   Basophils Absolute 0.0 0.0 - 0.1 K/uL   Immature Granulocytes 1 %   Abs Immature Granulocytes 0.08 (H) 0.00 - 0.07 K/uL    Comment: Performed at [facility], [address] [place], [place] [address]  Comprehensive metabolic panel     Status: Abnormal   Collection Time: 12/17/22  7:35 PM  Result Value Ref Range   Sodium 138 135 - 145 mmol/L   Potassium 3.2 (L) 3.5 - 5.1 mmol/L   Chloride 106 98 - 111 mmol/L   CO2 23 22 - 32 mmol/L   Glucose, Bld 90 70 - 99 mg/dL    Comment: Glucose reference range applies only to samples taken after fasting for at least 8 hours.   BUN 11 6 - 20 mg/dL   Creatinine, Ser 6.04 0.44 - 1.00 mg/dL    Calcium 9.3 8.9 - 54.0 mg/dL   Total Protein 6.9 6.5 - 8.1 g/dL   Albumin 4.0 3.5 - 5.0 g/dL   AST 23 15 - 41 U/L   ALT 28 0 - 44 U/L   Alkaline Phosphatase 59 38 - 126 U/L   Total Bilirubin 0.3 0.3 - 1.2 mg/dL   GFR, Estimated >98 >11 mL/min    Comment: (NOTE) Calculated using the CKD-EPI Creatinine Equation (2021)    Anion gap 9 5 - 15    Comment: Performed at [facility], [address], [place], [place] [address]   US Pelvis Complete  Result Date: 12/17/2022 CLINICAL DATA:  Fever assess for retained products EXAM: TRANSABDOMINAL ULTRASOUND OF PELVIS TECHNIQUE: Transabdominal ultrasound examination of the pelvis was performed including evaluation of the uterus, ovaries, adnexal regions, and pelvic cul-de-sac. COMPARISON:  Ob ultrasound 12/16/2022 FINDINGS: Uterus Measurements: 11.5 x 5.4 x 6.4 cm = volume: 209.4 mL. No fibroids or other mass visualized. Endometrium Thickness: 30.8 mm. Complex mass containing cystic areas at the fundal uterus measuring 2.1 x 1.9 x 3.1 cm. No increased vascularity. Right ovary Measurements: 3.3 x 2.6 x 3 cm = volume: 13 mL. Normal appearance/no adnexal mass. Left ovary Measurements: 2.7 x 2.1 x 2.3 cm = volume: 6.7 mL. Normal appearance/no adnexal mass. Other findings:  No abnormal free fluid. IMPRESSION: 3.1 cm complex endometrial mass. Sonographic findings suspicious for retained products of conception in the correct clinical setting. Electronically Signed   By: Jasmine Pang M.D.   On: 12/17/2022 21:00   US OB Comp Less 14 Wks  Result Date: 12/16/2022 CLINICAL DATA:  Confirmation of fetal demise EXAM: OBSTETRIC <14 WK ULTRASOUND TECHNIQUE: Transabdominal ultrasound was performed for evaluation of the gestation as well as the maternal uterus and adnexal regions. COMPARISON:  Previous pelvic sonogram done on 10/21/2022 FINDINGS: Intrauterine gestational sac: Single Yolk sac:  Not seen Embryo:  Not seen Cardiac Activity: Not seen MSD:   20.8 mm   7 w   2 d Subchorionic hemorrhage:  None visualized. Maternal uterus/adnexae: Unremarkable. There is no free fluid in pelvis. IMPRESSION: There is fluid collection, possibly gestational sac within the uterus without yolk sac or fetal pole or fetal cardiac activity. Findings suggest failed gestation with incomplete abortion. By mean sac diameter, estimated gestational age is 7 weeks 2 days. There are no adnexal masses.  There is no free fluid in pelvis. Electronically Signed   By: Ernie Avena M.D.   On: 12/16/2022 16:25  Pending Labs [employer] (From admission, onward)     Start     Ordered   Signed and Held  Urinalysis, Routine w reflex microscopic -Urine, Clean Catch  Once,   R       Question:  Specimen Source  Answer:  Urine, Clean Catch   Signed and Held            Vitals/Pain Today's Vitals   12/17/22 2026 12/17/22 2140 12/17/22 2145 12/17/22 2222  BP:  120/63 125/74   Pulse:  97 79   Resp:   18   Temp:   98.8 F (37.1 C)   TempSrc:   Oral   SpO2:  98% 98%   Weight:      Height:      PainSc: 7    4     Isolation Precautions No active isolations  Medications Medications  morphine (PF) 2 MG/ML injection 2 mg (2 mg Intravenous Given 12/17/22 2011)  sodium chloride 0.9 % bolus 1,000 mL (0 mLs Intravenous Stopped 12/17/22 2110)  ondansetron (ZOFRAN) injection 4 mg (4 mg Intravenous Given 12/17/22 2019)  cefTRIAXone (ROCEPHIN) 1 g in sodium chloride 0.9 % 100 mL IVPB (0 g Intravenous Stopped 12/17/22 2158)  HYDROmorphone (DILAUDID) injection 1 mg (1 mg Intravenous Given 12/17/22 2123)  ondansetron (ZOFRAN) injection 4 mg (4 mg Intravenous Given 12/17/22 2130)    Mobility walks     Focused Assessments OB/GYN - pt had D&C yesterday. Presented today with post operative pain and period-like bleeding. Fever at 1700. Pt took tylenol and ibuprofen at home and is afebrile in ED. US showed retained products. Surgeon saw pt in ED.    R Recommendations: See  Admitting Provider Note  Report given to:   Additional Notes: Having sx tomorrow. Surgeon placed admission and surgery signed & held orders. Pt has received morphine and dilaudid in ED and requires nausea meds with administration of any opioids.

## 2022-12-18 ENCOUNTER — Ambulatory Visit (HOSPITAL_COMMUNITY): Payer: BC Managed Care – PPO | Admitting: Anesthesiology

## 2022-12-18 ENCOUNTER — Encounter (HOSPITAL_COMMUNITY): Payer: Self-pay | Admitting: Obstetrics and Gynecology

## 2022-12-18 ENCOUNTER — Ambulatory Visit (HOSPITAL_COMMUNITY): Payer: BC Managed Care – PPO

## 2022-12-18 ENCOUNTER — Encounter (HOSPITAL_COMMUNITY)
Admission: EM | Disposition: A | Discharge status: Home / Self Care | Payer: Self-pay | Source: Home / Self Care | Attending: Emergency Medicine

## 2022-12-18 DIAGNOSIS — Z9889 Other specified postprocedural states: Secondary | ICD-10-CM

## 2022-12-18 HISTORY — PX: DILATION AND EVACUATION: SHX1459

## 2022-12-18 HISTORY — DX: Other specified postprocedural states: Z98.890

## 2022-12-18 LAB — TYPE AND SCREEN: ABO/RH(D): B NEG

## 2022-12-18 LAB — GLUCOSE, CAPILLARY
Glucose-Capillary: 174 mg/dL — ABNORMAL HIGH (ref 70–99)
Glucose-Capillary: 144 mg/dL — ABNORMAL HIGH (ref 70–99)

## 2022-12-18 SURGERY — Surgical Case
Anesthesia: General

## 2022-12-18 SURGERY — DILATION AND EVACUATION, UTERUS
Anesthesia: General

## 2022-12-18 MED ORDER — FENTANYL CITRATE (PF) 100 MCG/2ML IJ SOLN
INTRAMUSCULAR | Status: DC | PRN
  Administered 2022-12-18: 50 ug via INTRAVENOUS

## 2022-12-18 MED ORDER — DEXAMETHASONE SODIUM PHOSPHATE 10 MG/ML IJ SOLN
INTRAMUSCULAR | Status: DC | PRN
  Administered 2022-12-18: 5 mg via INTRAVENOUS

## 2022-12-18 MED ORDER — PROPOFOL 10 MG/ML IV BOLUS
INTRAVENOUS | Status: AC
  Filled 2022-12-18: qty 20

## 2022-12-18 MED ORDER — PROPOFOL 500 MG/50ML IV EMUL
INTRAVENOUS | Status: DC | PRN
  Administered 2022-12-18: 180 ug/kg/min via INTRAVENOUS

## 2022-12-18 MED ORDER — LIDOCAINE HCL URETHRAL/MUCOSAL 2 % EX GEL: 1.0000 | Freq: Four times a day (QID) | CUTANEOUS | 2 refills | Status: DC | PRN

## 2022-12-18 MED ORDER — SUCCINYLCHOLINE CHLORIDE 200 MG/10ML IV SOSY
PREFILLED_SYRINGE | INTRAVENOUS | Status: AC
  Filled 2022-12-18: qty 10

## 2022-12-18 MED ORDER — TRAMADOL HCL 50 MG PO TABS: 50.0000 mg | ORAL_TABLET | Freq: Four times a day (QID) | ORAL | 2 refills | Status: DC

## 2022-12-18 MED ORDER — MIDAZOLAM HCL 2 MG/2ML IJ SOLN
INTRAMUSCULAR | Status: AC
  Filled 2022-12-18: qty 2

## 2022-12-18 MED ORDER — ONDANSETRON HCL 4 MG PO TABS: 4.0000 mg | ORAL_TABLET | Freq: Four times a day (QID) | ORAL | 0 refills | Status: AC | PRN

## 2022-12-18 MED ORDER — SILVER NITRATE-POT NITRATE 75-25 % EX MISC
CUTANEOUS | Status: AC
  Filled 2022-12-18: qty 30

## 2022-12-18 MED ORDER — OXYCODONE HCL 5 MG/5ML PO SOLN: 5.0000 mg | Freq: Once | ORAL | Status: DC | PRN

## 2022-12-18 MED ORDER — LIDOCAINE HCL (PF) 2 % IJ SOLN
INTRAMUSCULAR | Status: AC
  Filled 2022-12-18: qty 5

## 2022-12-18 MED ORDER — DOXYCYCLINE HYCLATE 100 MG IV SOLR
200.0000 mg | INTRAVENOUS | Status: AC
  Administered 2022-12-18: 200 mg via INTRAVENOUS
  Filled 2022-12-18: qty 200

## 2022-12-18 MED ORDER — IBUPROFEN 800 MG PO TABS: 800.0000 mg | ORAL_TABLET | Freq: Three times a day (TID) | ORAL | 0 refills | Status: DC

## 2022-12-18 MED ORDER — KETOROLAC TROMETHAMINE 15 MG/ML IJ SOLN
INTRAMUSCULAR | Status: DC | PRN
  Administered 2022-12-18: 15 mg via INTRAVENOUS

## 2022-12-18 MED ORDER — ZOLPIDEM TARTRATE 5 MG PO TABS
5.0000 mg | ORAL_TABLET | Freq: Every evening | ORAL | Status: DC | PRN
  Administered 2022-12-18: 5 mg via ORAL
  Filled 2022-12-18: qty 1

## 2022-12-18 MED ORDER — LIDOCAINE HCL URETHRAL/MUCOSAL 2 % EX GEL
1.0000 | Freq: Four times a day (QID) | CUTANEOUS | Status: DC | PRN
  Administered 2022-12-18: 1 via TOPICAL
  Filled 2022-12-18 (×2): qty 5

## 2022-12-18 MED ORDER — IBUPROFEN 800 MG PO TABS
800.0000 mg | ORAL_TABLET | Freq: Three times a day (TID) | ORAL | Status: DC
  Administered 2022-12-18: 800 mg via ORAL
  Filled 2022-12-18: qty 1

## 2022-12-18 MED ORDER — SUCCINYLCHOLINE CHLORIDE 200 MG/10ML IV SOSY
PREFILLED_SYRINGE | INTRAVENOUS | Status: DC | PRN
  Administered 2022-12-18: 140 mg via INTRAVENOUS

## 2022-12-18 MED ORDER — FENTANYL CITRATE PF 50 MCG/ML IJ SOSY: 50.0000 ug | PREFILLED_SYRINGE | Freq: Once | INTRAMUSCULAR | Status: DC | PRN

## 2022-12-18 MED ORDER — POLYETHYLENE GLYCOL 3350 17 G PO PACK
17.0000 g | PACK | Freq: Every day | ORAL | Status: DC
  Administered 2022-12-18: 17 g via ORAL
  Filled 2022-12-18: qty 1

## 2022-12-18 MED ORDER — FENTANYL CITRATE PF 50 MCG/ML IJ SOSY: 50.0000 ug | PREFILLED_SYRINGE | INTRAMUSCULAR | Status: DC | PRN

## 2022-12-18 MED ORDER — ORAL CARE MOUTH RINSE
15.0000 mL | Freq: Once | OROMUCOSAL | Status: AC
  Administered 2022-12-18: 15 mL via OROMUCOSAL

## 2022-12-18 MED ORDER — DEXAMETHASONE SODIUM PHOSPHATE 10 MG/ML IJ SOLN
INTRAMUSCULAR | Status: AC
  Filled 2022-12-18: qty 1

## 2022-12-18 MED ORDER — MENTHOL 3 MG MT LOZG: 1.0000 | LOZENGE | OROMUCOSAL | Status: DC | PRN

## 2022-12-18 MED ORDER — OXYCODONE HCL 5 MG PO TABS: 5.0000 mg | ORAL_TABLET | Freq: Once | ORAL | Status: DC | PRN

## 2022-12-18 MED ORDER — DOXYCYCLINE HYCLATE 100 MG IV SOLR
200.0000 mg | INTRAVENOUS | Status: DC
  Filled 2022-12-18: qty 200

## 2022-12-18 MED ORDER — PROPOFOL 500 MG/50ML IV EMUL
INTRAVENOUS | Status: AC
  Filled 2022-12-18: qty 50

## 2022-12-18 MED ORDER — PROMETHAZINE HCL 25 MG/ML IJ SOLN
6.2500 mg | INTRAMUSCULAR | Status: DC | PRN
  Administered 2022-12-18: 12.5 mg via INTRAVENOUS

## 2022-12-18 MED ORDER — SODIUM CHLORIDE 0.9 % IV SOLN
25.0000 mg | Freq: Four times a day (QID) | INTRAVENOUS | Status: DC | PRN
  Administered 2022-12-18 (×2): 25 mg via INTRAVENOUS
  Filled 2022-12-18: qty 25
  Filled 2022-12-18: qty 1

## 2022-12-18 MED ORDER — ESCITALOPRAM OXALATE 20 MG PO TABS
20.0000 mg | ORAL_TABLET | Freq: Every day | ORAL | Status: DC
  Administered 2022-12-18: 20 mg via ORAL
  Filled 2022-12-18 (×2): qty 1

## 2022-12-18 MED ORDER — METHYLERGONOVINE MALEATE 0.2 MG/ML IJ SOLN
INTRAMUSCULAR | Status: DC | PRN
  Administered 2022-12-18: .2 mg via INTRAVENOUS

## 2022-12-18 MED ORDER — ONDANSETRON HCL 4 MG/2ML IJ SOLN
4.0000 mg | Freq: Four times a day (QID) | INTRAMUSCULAR | Status: DC | PRN
  Administered 2022-12-18: 4 mg via INTRAVENOUS
  Filled 2022-12-18: qty 2

## 2022-12-18 MED ORDER — ONDANSETRON HCL 4 MG PO TABS: 4.0000 mg | ORAL_TABLET | Freq: Four times a day (QID) | ORAL | Status: DC | PRN

## 2022-12-18 MED ORDER — DOXYCYCLINE MONOHYDRATE 100 MG PO CAPS: 100.0000 mg | ORAL_CAPSULE | Freq: Two times a day (BID) | ORAL | 0 refills | Status: AC

## 2022-12-18 MED ORDER — PROMETHAZINE HCL 25 MG/ML IJ SOLN
INTRAMUSCULAR | Status: AC
  Filled 2022-12-18: qty 1

## 2022-12-18 MED ORDER — CHLORHEXIDINE GLUCONATE 0.12 % MT SOLN: 15.0000 mL | Freq: Once | OROMUCOSAL | Status: DC

## 2022-12-18 MED ORDER — CEFTRIAXONE SODIUM 250 MG IJ SOLR: 250.0000 mg | INTRAMUSCULAR | Status: DC

## 2022-12-18 MED ORDER — METHYLERGONOVINE MALEATE 0.2 MG/ML IJ SOLN
INTRAMUSCULAR | Status: DC | PRN
  Administered 2022-12-18: .2 mg via INTRAMUSCULAR

## 2022-12-18 MED ORDER — FENTANYL CITRATE PF 50 MCG/ML IJ SOSY
25.0000 ug | PREFILLED_SYRINGE | INTRAMUSCULAR | Status: DC | PRN
  Administered 2022-12-18: 50 ug via INTRAVENOUS

## 2022-12-18 MED ORDER — FENTANYL CITRATE PF 50 MCG/ML IJ SOSY
PREFILLED_SYRINGE | INTRAMUSCULAR | Status: AC
  Filled 2022-12-18: qty 1

## 2022-12-18 MED ORDER — FENTANYL CITRATE (PF) 100 MCG/2ML IJ SOLN
INTRAMUSCULAR | Status: AC
  Filled 2022-12-18: qty 2

## 2022-12-18 MED ORDER — SILVER NITRATE-POT NITRATE 75-25 % EX MISC
CUTANEOUS | Status: DC | PRN
  Administered 2022-12-18: 1 via TOPICAL

## 2022-12-18 MED ORDER — SCOPOLAMINE 1 MG/3DAYS TD PT72
1.0000 | MEDICATED_PATCH | TRANSDERMAL | Status: DC
  Administered 2022-12-18: 1.5 mg via TRANSDERMAL

## 2022-12-18 MED ORDER — ROCURONIUM BROMIDE 10 MG/ML (PF) SYRINGE
PREFILLED_SYRINGE | INTRAVENOUS | Status: AC
  Filled 2022-12-18: qty 10

## 2022-12-18 MED ORDER — FENTANYL CITRATE PF 50 MCG/ML IJ SOSY
PREFILLED_SYRINGE | INTRAMUSCULAR | Status: AC
  Administered 2022-12-18: 50 ug via INTRAVENOUS
  Filled 2022-12-18: qty 2

## 2022-12-18 MED ORDER — PROPOFOL 10 MG/ML IV BOLUS
INTRAVENOUS | Status: DC | PRN
  Administered 2022-12-18: 150 mg via INTRAVENOUS

## 2022-12-18 MED ORDER — SODIUM CHLORIDE 0.9 % IV SOLN
1.0000 g | INTRAVENOUS | Status: DC
  Administered 2022-12-18: 1 g via INTRAVENOUS
  Filled 2022-12-18: qty 10

## 2022-12-18 MED ORDER — MIDAZOLAM HCL 2 MG/2ML IJ SOLN: 0.5000 mg | Freq: Once | INTRAMUSCULAR | Status: DC | PRN

## 2022-12-18 MED ORDER — GUAIFENESIN-DM 100-10 MG/5ML PO SYRP
5.0000 mL | ORAL_SOLUTION | ORAL | Status: DC | PRN
  Administered 2022-12-18: 5 mL via ORAL
  Filled 2022-12-18: qty 5

## 2022-12-18 MED ORDER — KETOROLAC TROMETHAMINE 30 MG/ML IJ SOLN
INTRAMUSCULAR | Status: AC
  Filled 2022-12-18: qty 1

## 2022-12-18 MED ORDER — MEPERIDINE HCL 50 MG/ML IJ SOLN: 6.2500 mg | INTRAMUSCULAR | Status: DC | PRN

## 2022-12-18 MED ORDER — OXYCODONE HCL 5 MG PO TABS
5.0000 mg | ORAL_TABLET | ORAL | Status: DC | PRN
  Administered 2022-12-18: 10 mg via ORAL
  Filled 2022-12-18 (×2): qty 1
  Filled 2022-12-18: qty 2

## 2022-12-18 MED ORDER — DOCUSATE SODIUM 100 MG PO CAPS
100.0000 mg | ORAL_CAPSULE | Freq: Two times a day (BID) | ORAL | Status: DC
  Administered 2022-12-18: 100 mg via ORAL
  Filled 2022-12-18: qty 1

## 2022-12-18 MED ORDER — MIDAZOLAM HCL 5 MG/5ML IJ SOLN
INTRAMUSCULAR | Status: DC | PRN
  Administered 2022-12-18: 2 mg via INTRAVENOUS

## 2022-12-18 MED ORDER — ACETAMINOPHEN 500 MG PO TABS
1000.0000 mg | ORAL_TABLET | Freq: Once | ORAL | Status: AC
  Administered 2022-12-18: 1000 mg via ORAL
  Filled 2022-12-18: qty 2

## 2022-12-18 MED ORDER — ACETAMINOPHEN 500 MG PO TABS
1000.0000 mg | ORAL_TABLET | Freq: Four times a day (QID) | ORAL | Status: DC
  Administered 2022-12-18: 1000 mg via ORAL
  Filled 2022-12-18: qty 2

## 2022-12-18 MED ORDER — METHYLERGONOVINE MALEATE 0.2 MG/ML IJ SOLN
INTRAMUSCULAR | Status: AC
  Filled 2022-12-18: qty 1

## 2022-12-18 MED ORDER — LIDOCAINE HCL (CARDIAC) PF 100 MG/5ML IV SOSY
PREFILLED_SYRINGE | INTRAVENOUS | Status: DC | PRN
  Administered 2022-12-18: 60 mg via INTRAVENOUS

## 2022-12-18 MED ORDER — ONDANSETRON HCL 4 MG/2ML IJ SOLN
INTRAMUSCULAR | Status: AC
  Filled 2022-12-18: qty 2

## 2022-12-18 MED ORDER — TRAMADOL HCL 50 MG PO TABS
50.0000 mg | ORAL_TABLET | Freq: Four times a day (QID) | ORAL | Status: DC
  Administered 2022-12-18 (×2): 50 mg via ORAL
  Filled 2022-12-18 (×2): qty 1

## 2022-12-18 MED ORDER — SCOPOLAMINE 1 MG/3DAYS TD PT72
MEDICATED_PATCH | TRANSDERMAL | Status: AC
  Filled 2022-12-18: qty 1

## 2022-12-18 SURGICAL SUPPLY — 22 items
BAG COUNTER SPONGE SURGICOUNT (BAG) IMPLANT
BAG SPNG CNTER NS LX DISP (BAG)
CATH ROBINSON RED A/P 16FR (CATHETERS) ×1 IMPLANT
GLOVE BIO SURGEON STRL SZ7 (GLOVE) ×1 IMPLANT
GLOVE BIOGEL PI IND STRL 7.0 (GLOVE) ×1 IMPLANT
GOWN STRL REUS W/ TWL LRG LVL3 (GOWN DISPOSABLE) ×2 IMPLANT
GOWN STRL REUS W/TWL LRG LVL3 (GOWN DISPOSABLE) ×2
KIT BERKELEY 1ST TRIMESTER 3/8 (MISCELLANEOUS) ×1 IMPLANT
NS IRRIG 1000ML POUR BTL (IV SOLUTION) ×1 IMPLANT
PACK VAGINAL MINOR WOMEN LF (CUSTOM PROCEDURE TRAY) ×1 IMPLANT
PAD OB MATERNITY 4.3X12.25 (PERSONAL CARE ITEMS) ×1 IMPLANT
PAD PREP 24X48 CUFFED NSTRL (MISCELLANEOUS) ×1 IMPLANT
PENCIL SMOKE EVACUATOR (MISCELLANEOUS) IMPLANT
SET BERKELEY SUCTION TUBING (SUCTIONS) ×1 IMPLANT
SPIKE FLUID TRANSFER (MISCELLANEOUS) ×1 IMPLANT
SUT VIC AB 2-0 CT1 27 (SUTURE) ×2
SUT VIC AB 2-0 CT1 TAPERPNT 27 (SUTURE) IMPLANT
TOWEL OR 17X26 10 PK STRL BLUE (TOWEL DISPOSABLE) ×2 IMPLANT
VACURETTE 10 RIGID CVD (CANNULA) IMPLANT
VACURETTE 7MM CVD STRL WRAP (CANNULA) IMPLANT
VACURETTE 8 RIGID CVD (CANNULA) IMPLANT
VACURETTE 9 RIGID CVD (CANNULA) IMPLANT

## 2022-12-18 NOTE — Progress Notes (Signed)
Pt had a really rough night and is still in  a lot of pain.  Had a coughing spell and robitussin given.  Trying to move surgery up.  Vitals:   12/17/22 2145 12/17/22 2230 12/17/22 2321 12/18/22 0520  BP: 125/74 (!) 95/55 113/71 121/64  Pulse: 79 72 62 73  Resp: 18 20 17 19   Temp: 98.8 F (37.1 C)  97.9 F (36.6 C) 98.4 F (36.9 C)  TempSrc: Oral     SpO2: 98% 98% 99% 100%  Weight:      Height:        Abd soft, tender but not distended.  Results for orders placed or performed during the hospital encounter of 12/17/22 (from the past 24 hour(s))  CBC with Differential     Status: Abnormal   Collection Time: 12/17/22  7:35 PM  Result Value Ref Range   WBC 11.8 (H) 4.0 - 10.5 K/uL   RBC 4.12 3.87 - 5.11 MIL/uL   Hemoglobin 12.3 12.0 - 15.0 g/dL   HCT 16.1 09.6 - 04.5 %   MCV 89.3 80.0 - 100.0 fL   MCH 29.9 26.0 - 34.0 pg   MCHC 33.4 30.0 - 36.0 g/dL   RDW 40.9 81.1 - 91.4 %   Platelets 232 150 - 400 K/uL   nRBC 0.0 0.0 - 0.2 %   Neutrophils Relative % 74 %   Neutro Abs 8.8 (H) 1.7 - 7.7 K/uL   Lymphocytes Relative 16 %   Lymphs Abs 1.9 0.7 - 4.0 K/uL   Monocytes Relative 8 %   Monocytes Absolute 0.9 0.1 - 1.0 K/uL   Eosinophils Relative 1 %   Eosinophils Absolute 0.1 0.0 - 0.5 K/uL   Basophils Relative 0 %   Basophils Absolute 0.0 0.0 - 0.1 K/uL   Immature Granulocytes 1 %   Abs Immature Granulocytes 0.08 (H) 0.00 - 0.07 K/uL  Comprehensive metabolic panel     Status: Abnormal   Collection Time: 12/17/22  7:35 PM  Result Value Ref Range   Sodium 138 135 - 145 mmol/L   Potassium 3.2 (L) 3.5 - 5.1 mmol/L   Chloride 106 98 - 111 mmol/L   CO2 23 22 - 32 mmol/L   Glucose, Bld 90 70 - 99 mg/dL   BUN 11 6 - 20 mg/dL   Creatinine, Ser 7.82 0.44 - 1.00 mg/dL   Calcium 9.3 8.9 - 95.6 mg/dL   Total Protein 6.9 6.5 - 8.1 g/dL   Albumin 4.0 3.5 - 5.0 g/dL   AST 23 15 - 41 U/L   ALT 28 0 - 44 U/L   Alkaline Phosphatase 59 38 - 126 U/L   Total Bilirubin 0.3 0.3 - 1.2 mg/dL    GFR, Estimated >21 >30 mL/min   Anion gap 9 5 - 15  Urinalysis, Routine w reflex microscopic -Urine, Clean Catch     Status: Abnormal   Collection Time: 12/17/22 11:08 PM  Result Value Ref Range   Color, Urine YELLOW YELLOW   APPearance HAZY (A) CLEAR   Specific Gravity, Urine 1.026 1.005 - 1.030   pH 5.0 5.0 - 8.0   Glucose, UA NEGATIVE NEGATIVE mg/dL   Hgb urine dipstick MODERATE (A) NEGATIVE   Bilirubin Urine NEGATIVE NEGATIVE   Ketones, ur NEGATIVE NEGATIVE mg/dL   Protein, ur 30 (A) NEGATIVE mg/dL   Nitrite NEGATIVE NEGATIVE   Leukocytes,Ua MODERATE (A) NEGATIVE   RBC / HPF 6-10 0 - 5 RBC/hpf   WBC, UA 11-20 0 -  5 WBC/hpf   Bacteria, UA RARE (A) NONE SEEN   Squamous Epithelial / HPF 6-10 0 - 5 /HPF   Mucus PRESENT   Type and screen     Status: None   Collection Time: 12/18/22 12:45 AM  Result Value Ref Range   ABO/RH(D) B NEG    Antibody Screen POS    Sample Expiration 12/21/2022,2359    Antibody Identification      PASSIVELY ACQUIRED ANTI-D Performed at [facility], [address] [place], [place] [address]     For repeat D&C for retained products with infection.  Will send home with lidocaine for vaginal and vulvar pain.  Doxycycline in OR; will do a second dose of ceftriaxone before leaving and then home with additional antibiotics to help with infection.

## 2022-12-18 NOTE — Anesthesia Procedure Notes (Signed)
Procedure Name: Intubation Date/Time: 12/18/2022 10:53 AM  Performed by: Johnette Abraham, CRNAPre-anesthesia Checklist: Patient identified, Emergency Drugs available, Suction available and Patient being monitored Patient Re-evaluated:Patient Re-evaluated prior to induction Oxygen Delivery Method: Circle System Utilized Preoxygenation: Pre-oxygenation with 100% oxygen Induction Type: IV induction, Cricoid Pressure applied and Rapid sequence Ventilation: Mask ventilation without difficulty Laryngoscope Size: Mac and 3 Grade View: Grade I Tube type: Oral Tube size: 7.0 mm Number of attempts: 1 Airway Equipment and Method: Stylet and Oral airway Placement Confirmation: ETT inserted through vocal cords under direct vision, positive ETCO2 and breath sounds checked- equal and bilateral Tube secured with: Tape Dental Injury: Teeth and Oropharynx as per pre-operative assessment

## 2022-12-18 NOTE — Brief Op Note (Signed)
12/18/2022  11:27 AM  PATIENT:  Christine Greene  37 y.o. female  PRE-OPERATIVE DIAGNOSIS:  RETAINED POC  POST-OPERATIVE DIAGNOSIS:  RETAINED POC  PROCEDURE:  Procedure(s): DILATATION AND EVACUATION (N/A)  SURGEON:  Surgeon(s) and Role:    * Carrington Clamp, MD - Primary  ANESTHESIA:   general  EBL:  100 mL    LOCAL MEDICATIONS USED:  NONE  SPECIMEN:  Source of Specimen:  uterine contents  DISPOSITION OF SPECIMEN:  PATHOLOGY  COUNTS:  YES  TOURNIQUET:  * No tourniquets in log *  DICTATION: .Note written in EPIC  PLAN OF CARE: Admit for overnight observation  PATIENT DISPOSITION:  PACU - hemodynamically stable.   Delay start of Pharmacological VTE agent (>24hrs) due to surgical blood loss or risk of bleeding: not applicable

## 2022-12-18 NOTE — Transfer of Care (Signed)
Immediate Anesthesia Transfer of Care Note  Patient: Christine Greene  Procedure(s) Performed: DILATATION AND EVACUATION  Patient Location: PACU  Anesthesia Type:General  Level of Consciousness: awake, alert , oriented, and patient cooperative  Airway & Oxygen Therapy: Patient Spontanous Breathing and Patient connected to face mask oxygen  Post-op Assessment: Report given to RN, Post -op Vital signs reviewed and stable, and Patient moving all extremities  Post vital signs: Reviewed and stable  Last Vitals:  Vitals Value Taken Time  BP 109/75 12/18/22 1138  Temp 36.6 C 12/18/22 1138  Pulse 78 12/18/22 1141  Resp 14 12/18/22 1141  SpO2 98 % 12/18/22 1141  Vitals shown include unvalidated device data.  Last Pain:  Vitals:   12/18/22 1010  TempSrc:   PainSc: 3          Complications: No notable events documented.

## 2022-12-18 NOTE — Progress Notes (Signed)
Pt feeling much better, does have some upper back pain but likely from or table.  Less pain abdominally and no fever 24 hours.  Vitals:   12/18/22 1300 12/18/22 1315 12/18/22 1346 12/18/22 1734  BP: 105/66 97/68 129/73 124/62  Pulse: 62 61 72 75  Resp: 12 13 18 16   Temp:   (!) 97.5 F (36.4 C) 97.7 F (36.5 C)  TempSrc:   Oral Oral  SpO2: 97% 97% 100% 98%  Weight:      Height:        Abd soft, nontender and nondistended.  Pt agrees that best plan is to go home after second dose of antibiotics.  Will send home with doxycycline for 14 days and see next Monday.

## 2022-12-18 NOTE — Discharge Summary (Signed)
Physician Discharge Summary  Patient ID: Christine Greene MRN: [id number] DOB/AGE: [birth year] 37 y.o.  Admit date: 12/17/2022 Discharge date: 12/18/2022  Admission Diagnoses:  Discharge Diagnoses:  Principal Problem:   Retained products of conception after miscarriage Active Problems:   Postoperative state   Post-operative state   Discharged Condition: good  Hospital Course: Obs and pain control overnight until repeat d&e; f/u US shows only fluid in EM and no increased blood flow.    Consults: None  Significant Diagnostic Studies: labs: see progress  Treatments: IV hydration and antibiotics: ceftriaxone, repeat D&E; Rhogam given last visit.  Discharge Exam: Blood pressure 124/62, pulse 75, temperature 97.7 F (36.5 C), temperature source Oral, resp. rate 16, height 5' (1.524 m), weight 77.6 kg, last menstrual period 09/22/2022, SpO2 98 %, not currently breastfeeding.   Disposition: Discharge disposition: 01-Home or Self Care       Discharge Instructions     Call MD for:  temperature >100.4   Complete by: As directed    Diet - low sodium heart healthy   Complete by: As directed    Discharge instructions   Complete by: As directed    No driving on narcotics, no sexual activity for 2 weeks.   Increase activity slowly   Complete by: As directed    May shower / Bathe   Complete by: As directed    Shower, no bath for 2 weeks.   Remove dressing in 24 hours   Complete by: As directed    Sexual Activity Restrictions   Complete by: As directed    No sexual activity for 2 weeks.      Allergies as of 12/18/2022       Reactions   Chlorhexidine Gluconate Itching        Medication List     TAKE these medications    doxycycline 100 MG capsule Commonly known as: MONODOX Take 1 capsule (100 mg total) by mouth 2 (two) times daily for 14 days.   escitalopram 20 MG tablet Commonly known as: LEXAPRO Take 20 mg by mouth at bedtime.   ibuprofen 800 MG  tablet Commonly known as: ADVIL Take 1 tablet (800 mg total) by mouth 3 (three) times daily.   lidocaine 2 % jelly Commonly known as: XYLOCAINE Apply 1 Application topically 4 (four) times daily as needed (for vaginal pain; can apply to vulva and inside of vagina).   multivitamin-prenatal 27-0.8 MG Tabs tablet Take 1 tablet by mouth daily at 12 noon.   ondansetron 4 MG tablet Commonly known as: ZOFRAN Take 1 tablet (4 mg total) by mouth every 6 (six) hours as needed for nausea.   traMADol 50 MG tablet Commonly known as: ULTRAM Take 1 tablet (50 mg total) by mouth every 6 (six) hours.        Follow-up Information     Carrington Clamp, MD Follow up in 1 week(s).   Specialty: Obstetrics and Gynecology Why: has appt on Monday Contact information: [address] [place] [address] [phone]                 Signed: Loney Laurence 12/18/2022, 6:21 PM

## 2022-12-18 NOTE — Progress Notes (Signed)
Spoke w/[facility] Pharmacist and Dr. Henderson Cloud.... Ok not to adm Oracit and Cyklokapron at 1800... should've been a one time dose in OR  Also, per Dr. Henderson Cloud, ok to give Lexapro before scheduled dose at 2200 today and before she gets d/c to home.

## 2022-12-18 NOTE — Op Note (Signed)
12/18/2022  11:27 AM  PATIENT:  Christine Greene  37 y.o. female  PRE-OPERATIVE DIAGNOSIS:  RETAINED POC  POST-OPERATIVE DIAGNOSIS:  RETAINED POC  PROCEDURE:  Procedure(s): DILATATION AND EVACUATION (N/A)  SURGEON:  Surgeon(s) and Role:    * Carrington Clamp, MD - Primary  ANESTHESIA:   general  EBL:  100 mL    LOCAL MEDICATIONS USED:  NONE  SPECIMEN:  Source of Specimen:  uterine contents  DISPOSITION OF SPECIMEN:  PATHOLOGY  COUNTS:  YES  TOURNIQUET:  * No tourniquets in log *  DICTATION: .Note written in EPIC  PLAN OF CARE: Admit for overnight observation  PATIENT DISPOSITION:  PACU - hemodynamically stable.   Delay start of Pharmacological VTE agent (>24hrs) due to surgical blood loss or risk of bleeding: not applicable  Medications: Methergine  Complications: None  Findings:  7 week size uterus to 6 size post procedure.  Good crie was achieved.  After adequate anesthesia was achieved, the patient was prepped and draped in the usual sterile fashion.  The speculum was placed in the vagina and the cervix stabilized with a single-tooth tenaculum.  The cervix was dilated with Shawnie Pons dilators and the 10 mm curette was used to remove contents of the uterus.  Alternating sharp curettage with a curette and suction curettage was performed until all contents were removed and good crie was achieved.  The tenaculum site was bleeding and three stitches of 2-0 vicryl were placed for hemostasis.  All instruments were removed from the vagina.  The patient tolerated the procedure well.    Mikal Blasdell A

## 2022-12-18 NOTE — Anesthesia Postprocedure Evaluation (Signed)
Anesthesia Post Note  Patient: Christine Greene  Procedure(s) Performed: DILATATION AND EVACUATION     Patient location during evaluation: PACU Anesthesia Type: General Level of consciousness: awake and alert, patient cooperative and oriented Pain management: pain level controlled Vital Signs Assessment: post-procedure vital signs reviewed and stable Respiratory status: spontaneous breathing, nonlabored ventilation and respiratory function stable Cardiovascular status: blood pressure returned to baseline and stable Postop Assessment: no apparent nausea or vomiting Anesthetic complications: no   No notable events documented.  Last Vitals:  Vitals:   12/18/22 1315 12/18/22 1346  BP: 97/68 129/73  Pulse: 61 72  Resp: 13 18  Temp:  (!) 36.4 C  SpO2: 97% 100%    Last Pain:  Vitals:   12/18/22 1346  TempSrc: Oral  PainSc:                  Emmit Oriley,E. Chantella Creech

## 2022-12-18 NOTE — Anesthesia Preprocedure Evaluation (Addendum)
Anesthesia Evaluation  Patient identified by MRN, date of birth, ID band Patient awake    Reviewed: Allergy & Precautions, NPO status , Patient's Chart, lab work & pertinent test results  History of Anesthesia Complications (+) PONV  Airway Mallampati: II  TM Distance: >3 FB Neck ROM: Full    Dental  (+) Dental Advisory Given   Pulmonary neg pulmonary ROS   breath sounds clear to auscultation       Cardiovascular negative cardio ROS  Rhythm:Regular Rate:Normal     Neuro/Psych negative neurological ROS     GI/Hepatic Neg liver ROS,GERD  Poorly Controlled,,N/v with retained products of conception   Endo/Other  BMI 33.4  Renal/GU negative Renal ROS     Musculoskeletal   Abdominal   Peds  Hematology Hb 12.3, plt 232k   Anesthesia Other Findings   Reproductive/Obstetrics Retained products of conception                              Anesthesia Physical Anesthesia Plan  ASA: 2  Anesthesia Plan: General   Post-op Pain Management: Tylenol PO (pre-op)* and Minimal or no pain anticipated   Induction: Intravenous and Rapid sequence  PONV Risk Score and Plan: 4 or greater and Ondansetron, Dexamethasone, Scopolamine patch - Pre-op and TIVA  Airway Management Planned: Oral ETT  Additional Equipment: None  Intra-op Plan:   Post-operative Plan: Extubation in OR  Informed Consent: I have reviewed the patients History and Physical, chart, labs and discussed the procedure including the risks, benefits and alternatives for the proposed anesthesia with the patient or authorized representative who has indicated his/her understanding and acceptance.     Dental advisory given  Plan Discussed with: CRNA and Surgeon  Anesthesia Plan Comments:         Anesthesia Quick Evaluation

## 2022-12-19 ENCOUNTER — Encounter (HOSPITAL_COMMUNITY): Payer: Self-pay | Admitting: Obstetrics and Gynecology

## 2022-12-19 LAB — SURGICAL PATHOLOGY

## 2022-12-19 LAB — TYPE AND SCREEN: Antibody Screen: POSITIVE

## 2023-01-16 ENCOUNTER — Emergency Department (HOSPITAL_COMMUNITY)
Admission: EM | Admit: 2023-01-16 | Discharge: 2023-01-16 | Disposition: A | Discharge status: Home / Self Care | Payer: BC Managed Care – PPO | Attending: Emergency Medicine | Admitting: Emergency Medicine

## 2023-01-16 ENCOUNTER — Other Ambulatory Visit: Payer: Self-pay

## 2023-01-16 ENCOUNTER — Encounter (HOSPITAL_COMMUNITY): Payer: Self-pay | Admitting: Emergency Medicine

## 2023-01-16 DIAGNOSIS — R45851 Suicidal ideations: Secondary | ICD-10-CM

## 2023-01-16 DIAGNOSIS — F53 Postpartum depression: Secondary | ICD-10-CM | POA: Insufficient documentation

## 2023-01-16 DIAGNOSIS — F32A Depression, unspecified: Secondary | ICD-10-CM

## 2023-01-16 DIAGNOSIS — Z79899 Other long term (current) drug therapy: Secondary | ICD-10-CM | POA: Diagnosis not present

## 2023-01-16 LAB — CBC
HCT: 38.2 % (ref 36.0–46.0)
Hemoglobin: 13.1 g/dL (ref 12.0–15.0)
MCH: 30.2 pg (ref 26.0–34.0)
MCHC: 34.3 g/dL (ref 30.0–36.0)
MCV: 88 fL (ref 80.0–100.0)
Platelets: 199 10*3/uL (ref 150–400)
RBC: 4.34 MIL/uL (ref 3.87–5.11)
RDW: 12.5 % (ref 11.5–15.5)
WBC: 6.1 10*3/uL (ref 4.0–10.5)
nRBC: 0 % (ref 0.0–0.2)

## 2023-01-16 LAB — RAPID URINE DRUG SCREEN, HOSP PERFORMED
Amphetamines: NOT DETECTED
Barbiturates: NOT DETECTED
Benzodiazepines: NOT DETECTED
Cocaine: NOT DETECTED
Opiates: NOT DETECTED
Tetrahydrocannabinol: NOT DETECTED

## 2023-01-16 LAB — COMPREHENSIVE METABOLIC PANEL
ALT: 18 U/L (ref 0–44)
AST: 17 U/L (ref 15–41)
Albumin: 4.1 g/dL (ref 3.5–5.0)
Alkaline Phosphatase: 67 U/L (ref 38–126)
Anion gap: 10 (ref 5–15)
BUN: 13 mg/dL (ref 6–20)
CO2: 25 mmol/L (ref 22–32)
Calcium: 8.7 mg/dL — ABNORMAL LOW (ref 8.9–10.3)
Chloride: 102 mmol/L (ref 98–111)
Creatinine, Ser: 0.7 mg/dL (ref 0.44–1.00)
GFR, Estimated: >60 mL/min (ref >60)
Glucose, Bld: 102 mg/dL — ABNORMAL HIGH (ref 70–99)
Potassium: 3.3 mmol/L — ABNORMAL LOW (ref 3.5–5.1)
Sodium: 137 mmol/L (ref 135–145)
Total Bilirubin: 0.4 mg/dL (ref 0.3–1.2)
Total Protein: 7 g/dL (ref 6.5–8.1)

## 2023-01-16 LAB — SALICYLATE LEVEL: Salicylate Lvl: <7 mg/dL — ABNORMAL LOW (ref 7.0–30.0)

## 2023-01-16 LAB — ETHANOL: Alcohol, Ethyl (B): <10 mg/dL (ref <10)

## 2023-01-16 LAB — ACETAMINOPHEN LEVEL: Acetaminophen (Tylenol), Serum: <10 ug/mL — ABNORMAL LOW (ref 10–30)

## 2023-01-16 LAB — HCG, SERUM, QUALITATIVE: Preg, Serum: NEGATIVE

## 2023-01-16 MED ORDER — BUSPIRONE HCL 10 MG PO TABS: 5.0000 mg | ORAL_TABLET | Freq: Two times a day (BID) | ORAL | Status: DC

## 2023-01-16 MED ORDER — ZOLPIDEM TARTRATE 5 MG PO TABS: 5.0000 mg | ORAL_TABLET | Freq: Every evening | ORAL | Status: DC | PRN

## 2023-01-16 MED ORDER — ACETAMINOPHEN 325 MG PO TABS: 650.0000 mg | ORAL_TABLET | ORAL | Status: DC | PRN

## 2023-01-16 NOTE — ED Provider Notes (Signed)
The patient was evaluated by the behavioral health service, who had initially recommended inpatient management.  However, I went back to the room to discuss with the patient, her best friend and her husband who is now present.  They have a strong preference to go home and do not wish to stay in the hospital.  The patient expresses specific concerns about being removed from her family, which is the only source of joy in her life currently, including her 4 children.  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  I am inclined to agree with this assessment.  Both her husband and her friend feel that the patient is safe and can be contracted for safety for discharge home.  The patient already has follow-up established with a therapist on Monday, and in addition was provided resources for the behavioral health center, both for walk-in crisis center and also to schedule an appointment as needed.  She can continue with her current antidepressant medications.  I do not see grounds for involuntary commitment at this time.  The patient has not demonstrated evidence of psychosis.  She is calm, oriented, cooperative, demonstrates good insight and understanding into her medical condition.  She denies intention for suicide or plan for SI.   Terald Sleeper, MD 01/16/23 2135

## 2023-01-16 NOTE — ED Triage Notes (Signed)
Pt reports miscarriage this year and diagnosed with postpartum depression. Pt was on lexapro before miscarriage and wellbutrin now. Pt endorses suicidal dreams and thoughts. Sent by Dr. Henderson Cloud. Pt does not have plan on how to harm, just has been having dreams.

## 2023-01-16 NOTE — ED Notes (Signed)
Pt. Is dressed out.

## 2023-01-16 NOTE — ED Provider Notes (Signed)
[place] EMERGENCY DEPARTMENT AT [facility] Provider Note   CSN: [id number] Arrival date & time: 01/16/23  1419     History  Chief Complaint  Patient presents with   Depression    Christine Greene is a 37 y.o. female presenting to the ED with concern for depression.  The patient reports that she had a miscarriage earlier this year which required a D&C.  She said she has been extremely depressed since this episode.  She says she never leaves the house.  She is having dreams about suicidal thoughts, although she has no active suicidal plan.  She spoke to her doctor who strongly courage her to come to the hospital.  She does have an underlying history of depression or anxiety, for which she takes Lexapro, but denies prior history of suicide attempt.  She is here with her best friend.  The patient reports she has 4 other children at home, and her husband who is home taking care of them.  She is here voluntarily  HPI     Home Medications Prior to Admission medications   Medication Sig Start Date End Date Taking? Authorizing Provider  escitalopram (LEXAPRO) 20 MG tablet Take 20 mg by mouth at bedtime.    [provider]  ibuprofen (ADVIL) 800 MG tablet Take 1 tablet (800 mg total) by mouth 3 (three) times daily. 12/18/22   Carrington Clamp, MD  lidocaine (XYLOCAINE) 2 % jelly Apply 1 Application topically 4 (four) times daily as needed (for vaginal pain; can apply to vulva and inside of vagina). 12/18/22   Carrington Clamp, MD  ondansetron (ZOFRAN) 4 MG tablet Take 1 tablet (4 mg total) by mouth every 6 (six) hours as needed for nausea. 12/18/22   Carrington Clamp, MD  Prenatal Vit-Fe Fumarate-FA (MULTIVITAMIN-PRENATAL) 27-0.8 MG TABS tablet Take 1 tablet by mouth daily at 12 noon.    [provider]  traMADol (ULTRAM) 50 MG tablet Take 1 tablet (50 mg total) by mouth every 6 (six) hours. 12/18/22   Carrington Clamp, MD      Allergies    Chlorhexidine  gluconate    Review of Systems   Review of Systems  Physical Exam Updated Vital Signs BP (!) 114/97 (BP Location: Left Arm)   Pulse (!) 105   Temp 98.4 F (36.9 C) (Oral)   Resp 20   Ht 5' (1.524 m)   Wt 77.6 kg   SpO2 98%   BMI 33.41 kg/m  Physical Exam Constitutional:      General: She is not in acute distress. HENT:     Head: Normocephalic and atraumatic.  Eyes:     Conjunctiva/sclera: Conjunctivae normal.     Pupils: Pupils are equal, round, and reactive to light.  Cardiovascular:     Rate and Rhythm: Normal rate and regular rhythm.  Pulmonary:     Effort: Pulmonary effort is normal. No respiratory distress.  Abdominal:     General: There is no distension.     Tenderness: There is no abdominal tenderness.  Skin:    General: Skin is warm and dry.  Neurological:     General: No focal deficit present.     Mental Status: She is alert. Mental status is at baseline.  Psychiatric:        Mood and Affect: Mood normal.        Behavior: Behavior normal.     ED Results / Procedures / Treatments   Labs (all labs ordered are listed, but  only abnormal results are displayed) Labs Reviewed  COMPREHENSIVE METABOLIC PANEL - Abnormal; Notable for the following components:      Result Value   Potassium 3.3 (*)    Glucose, Bld 102 (*)    Calcium 8.7 (*)    All other components within normal limits  SALICYLATE LEVEL - Abnormal; Notable for the following components:   Salicylate Lvl <7.0 (*)    All other components within normal limits  ACETAMINOPHEN LEVEL - Abnormal; Notable for the following components:   Acetaminophen (Tylenol), Serum <10 (*)    All other components within normal limits  ETHANOL  CBC  RAPID URINE DRUG SCREEN, HOSP PERFORMED  HCG, SERUM, QUALITATIVE    EKG None  Radiology No results found.  Procedures Procedures    Medications Ordered in ED Medications  acetaminophen (TYLENOL) tablet 650 mg (has no administration in time range)    ED  Course/ Medical Decision Making/ A&P                             Medical Decision Making Amount and/or Complexity of Data Reviewed Labs: ordered.  Risk OTC drugs.   This patient presents to the Emergency Department with complaint of psychiatric disturbance. This involves an extensive number of treatment options, and is a complaint that carries with it a high risk of complications and morbidity.   I ordered, reviewed, and interpreted labs, including BMP and CBC.  There were no immediate, life-threatening emergencies found in this labwork.  The patient was medically cleared for TTS and psychiatric evaluation.  At this time, the patient is NOT under IVC.  Patient remains voluntary and willing to proceed with psychiatric care. Additional history was obtained from patient's friend  (with pt's permission) I personally reviewed the patients ECG which showed sinus rhythm with no acute ischemic findings  At this time the patient is medically cleared for TTS evaluation.         Final Clinical Impression(s) / ED Diagnoses Final diagnoses:  Depression, unspecified depression type  Suicidal ideation    Rx / DC Orders ED Discharge Orders     None         Terald Sleeper, MD 01/16/23 [phone]

## 2023-01-16 NOTE — ED Notes (Signed)
Patient has been calm and cooperative. She is surrounded by family and friend. She wants to go home. She does not want to stay and wait for inpatient. "Im not suicidal, Im not going to kill myself".

## 2023-01-16 NOTE — Consult Note (Signed)
[facility] ED ASSESSMENT   Reason for Consult: Psych Consult Referring Physician: Dr. Renaye Rakers  Patient Identification: Christine Greene MRN:  [id number] ED Chief Complaint: Suicidal ideation  Diagnosis:  Principal Problem:   Suicidal ideation Active Problems:   Postpartum depression   ED Assessment Time Calculation: Start Time: 1800 Stop Time: 1840 Total Time in Minutes (Assessment Completion): 40   Subjective: Per Triage Note " Pt reports miscarriage this year and diagnosed with postpartum depression. Pt was on lexapro before miscarriage and wellbutrin now. Pt endorses suicidal dreams and thoughts. Sent by Dr. Henderson Cloud. Pt does not have plan on how to harm, just has been having dreams".    HPI: Christine Greene, 37 y.o., female patient seen face to face by this provider, consulted with Dr. Katherine Basset; and chart reviewed on 01/16/23.  On evaluation [name] reports that she is having some depression, she states that she had a "missed abortion "where she had a miscarriage on June 17 is when she found out, and on December 18, 2022 she had surgery of a suction D&C.  She states that since then, she has not been feeling like herself states that she is having dreams with vivid pictures of how she would commit suicide, by slitting her wrist.  She states that she would never want to hurt herself, states she has 4 other children and is also married and does not want to leave them behind.  She states that she has been feeling so guilty for not being there for her other children due to her depression, also was feeling guilty for not being able to carry her last baby.  She states that about 2 weeks ago while her family was in the house laughing, she states that she was feeling so depressed and did not feel like herself, states she walked out of her home and just her underwear and T-shirt, states that her husband brought her back in the house.  Patient states that she has never been admitted to inpatient psychiatric  facility, has been receiving her antidepressant medications from her OB/GYN Dr. Henderson Cloud, states that she was on Lexapro for 9 years, but felt like it did not do anything for her, says that she just recently started Wellbutrin and has been on that for 1 week.  Patient states she has severe anxiety.  States that her sleep and appetite are poor.  She currently endorses SI, with no plan, denies HI/AVH.  She states that her mind is telling her she is worthless and people especially her family would be better off without her.  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 Patient denies using any illicit substances or ETOH. UDS negative and BAL < 10.   During evaluation Christine Greene is sitting on the hospital chair in no acute distress. She is alert, oriented x 4, calm, cooperative and attentive. Her mood is sad with congruent affect.  Patient is extremely tearful. She has normal speech, and behavior.  Objectively there is no evidence of psychosis/mania or delusional thinking.  Patient is able to converse coherently, goal directed thoughts, no distractibility, or pre-occupation. She denies homicidal ideation, psychosis, and paranoia.  Patient endorses suicidal ideation and thoughts, with no plan states she has just been having a lot of dreams and committed suicide.  Discussed with patient about being admitted to an inpatient psychiatric facility, patient is hesitant and states she does not want to go, says that she does not want to be by  herself (without husband and children), the right now she is willing to go because she does agree that she needs help.  Patient best friend Boneta Lucks is here with her, and is in agreement with everything patient is saying, and feels that patient needs treatment.   Past Psychiatric History: Anxiety  Risk to Self or Others: Risk to Self:  Yes Risk to Others:  No  Prior Inpatient Therapy: Yes  Prior Outpatient Therapy: No   [place] Scale:  Flowsheet Row ED  from 01/16/2023 in [facility] Emergency Department at [facility] ED to Hosp-Admission (Discharged) from 12/17/2022 in [facility] Admission (Discharged) from 12/16/2022 in [facility]  C-SSRS RISK CATEGORY Low Risk No Risk No Risk       AIMS:  , , ,  ,   ASAM:    Substance Abuse:     Past Medical History:  Past Medical History:  Diagnosis Date   Anxiety    history of Anemia    PONV (postoperative nausea and vomiting)    Preterm labor     Past Surgical History:  Procedure Laterality Date   APPENDECTOMY  2008   DILATION AND CURETTAGE OF UTERUS     DILATION AND EVACUATION N/A 12/16/2022   Procedure: DILATATION AND EVACUATION;  Surgeon: Carrington Clamp, MD;  Location: [facility];  Service: Gynecology;  Laterality: N/A;   DILATION AND EVACUATION N/A 12/18/2022   Procedure: DILATATION AND EVACUATION;  Surgeon: Carrington Clamp, MD;  Location: [facility];  Service: Gynecology;  Laterality: N/A;   ECTOPIC PREGNANCY SURGERY  2007   LAPAROSCOPY FOR ECTOPIC PREGNANCY     VAGINAL DELIVERY  11/05/2011   Procedure: VAGINAL DELIVERY;  Surgeon: Levi Aland, MD;  Location: [facility];  Service: Gynecology;  Laterality: N/A;   Family History:  Family History  Problem Relation Age of Onset   Diabetes Father     Social History:  Social History   Substance and Sexual Activity  Alcohol Use No     Social History   Substance and Sexual Activity  Drug Use No    Social History   Socioeconomic History   Marital status: Married    Spouse name: Not on file   Number of children: Not on file   Years of education: Not on file   Highest education level: Not on file  Occupational History   Not on file  Tobacco Use   Smoking status: Never   Smokeless tobacco: Never  Vaping Use   Vaping status: Never Used  Substance and Sexual Activity   Alcohol use: No   Drug use: No   Sexual activity: Not Currently    Birth  control/protection: None  Other Topics Concern   Not on file  Social History Narrative   Not on file   Social Determinants of Health   Financial Resource Strain: Not on file  Food Insecurity: No Food Insecurity (12/18/2022)   Hunger Vital Sign    Worried About Running Out of Food in the Last Year: Never true    Ran Out of Food in the Last Year: Never true  Transportation Needs: No Transportation Needs (12/18/2022)   PRAPARE - Administrator, Civil Service (Medical): No    Lack of Transportation (Non-Medical): No  Physical Activity: Not on file  Stress: Not on file  Social Connections: Not on file      Allergies:   Allergies  Allergen Reactions   Chlorhexidine Gluconate Itching  Labs:  Results for orders placed or performed during the hospital encounter of 01/16/23 (from the past 48 hour(s))  Rapid urine drug screen (hospital performed)     Status: None   Collection Time: 01/16/23  2:39 PM  Result Value Ref Range   Opiates NONE DETECTED NONE DETECTED   Cocaine NONE DETECTED NONE DETECTED   Benzodiazepines NONE DETECTED NONE DETECTED   Amphetamines NONE DETECTED NONE DETECTED   Tetrahydrocannabinol NONE DETECTED NONE DETECTED   Barbiturates NONE DETECTED NONE DETECTED    Comment: (NOTE) DRUG SCREEN FOR MEDICAL PURPOSES ONLY.  IF CONFIRMATION IS NEEDED FOR ANY PURPOSE, NOTIFY LAB WITHIN 5 DAYS.  LOWEST DETECTABLE LIMITS FOR URINE DRUG SCREEN Drug Class                     Cutoff (ng/mL) Amphetamine and metabolites    1000 Barbiturate and metabolites    200 Benzodiazepine                 200 Opiates and metabolites        300 Cocaine and metabolites        300 THC                            50 Performed at [facility], [address] [place], [place] [address]   Comprehensive metabolic panel     Status: Abnormal   Collection Time: 01/16/23  3:10 PM  Result Value Ref Range   Sodium 137 135 - 145 mmol/L   Potassium 3.3 (L) 3.5 -  5.1 mmol/L   Chloride 102 98 - 111 mmol/L   CO2 25 22 - 32 mmol/L   Glucose, Bld 102 (H) 70 - 99 mg/dL    Comment: Glucose reference range applies only to samples taken after fasting for at least 8 hours.   BUN 13 6 - 20 mg/dL   Creatinine, Ser 6.04 0.44 - 1.00 mg/dL   Calcium 8.7 (L) 8.9 - 10.3 mg/dL   Total Protein 7.0 6.5 - 8.1 g/dL   Albumin 4.1 3.5 - 5.0 g/dL   AST 17 15 - 41 U/L   ALT 18 0 - 44 U/L   Alkaline Phosphatase 67 38 - 126 U/L   Total Bilirubin 0.4 0.3 - 1.2 mg/dL   GFR, Estimated >54 >09 mL/min    Comment: (NOTE) Calculated using the CKD-EPI Creatinine Equation (2021)    Anion gap 10 5 - 15    Comment: Performed at [facility], [address], [place], [place] [address]  Ethanol     Status: None   Collection Time: 01/16/23  3:10 PM  Result Value Ref Range   Alcohol, Ethyl (B) <10 <10 mg/dL    Comment: (NOTE) Lowest detectable limit for serum alcohol is 10 mg/dL.  For medical purposes only. Performed at [facility], [address] [place], [place] [address]   Salicylate level     Status: Abnormal   Collection Time: 01/16/23  3:10 PM  Result Value Ref Range   Salicylate Lvl <7.0 (L) 7.0 - 30.0 mg/dL    Comment: Performed at [facility], [address] [place], [place] [address]  Acetaminophen level     Status: Abnormal   Collection Time: 01/16/23  3:10 PM  Result Value Ref Range   Acetaminophen (Tylenol), Serum <10 (L) 10 - 30 ug/mL    Comment: (NOTE) Therapeutic concentrations vary  significantly. A range of 10-30 ug/mL  may be an effective concentration for many patients. However, some  are best treated at concentrations outside of this range. Acetaminophen concentrations >150 ug/mL at 4 hours after ingestion  and >50 ug/mL at 12 hours after ingestion are often associated with  toxic reactions.  Performed at [facility], [address], [place], [place] [address]   cbc      Status: None   Collection Time: 01/16/23  3:10 PM  Result Value Ref Range   WBC 6.1 4.0 - 10.5 K/uL   RBC 4.34 3.87 - 5.11 MIL/uL   Hemoglobin 13.1 12.0 - 15.0 g/dL   HCT 29.5 62.1 - 30.8 %   MCV 88.0 80.0 - 100.0 fL   MCH 30.2 26.0 - 34.0 pg   MCHC 34.3 30.0 - 36.0 g/dL   RDW 65.7 84.6 - 96.2 %   Platelets 199 150 - 400 K/uL   nRBC 0.0 0.0 - 0.2 %    Comment: Performed at [facility], [address], [place], [place] [address]  hCG, serum, qualitative     Status: None   Collection Time: 01/16/23  3:10 PM  Result Value Ref Range   Preg, Serum NEGATIVE NEGATIVE    Comment:        THE SENSITIVITY OF THIS METHODOLOGY IS >10 mIU/mL. Performed at [facility], [address] [place], [place] [address]     Current Facility-Administered Medications  Medication Dose Route Frequency Provider Last Rate Last Admin   acetaminophen (TYLENOL) tablet 650 mg  650 mg Oral Q4H PRN Terald Sleeper, MD       Current Outpatient Medications  Medication Sig Dispense Refill   escitalopram (LEXAPRO) 20 MG tablet Take 20 mg by mouth at bedtime.     ibuprofen (ADVIL) 800 MG tablet Take 1 tablet (800 mg total) by mouth 3 (three) times daily. 30 tablet 0   lidocaine (XYLOCAINE) 2 % jelly Apply 1 Application topically 4 (four) times daily as needed (for vaginal pain; can apply to vulva and inside of vagina). 20 mL 2   ondansetron (ZOFRAN) 4 MG tablet Take 1 tablet (4 mg total) by mouth every 6 (six) hours as needed for nausea. 20 tablet 0   Prenatal Vit-Fe Fumarate-FA (MULTIVITAMIN-PRENATAL) 27-0.8 MG TABS tablet Take 1 tablet by mouth daily at 12 noon.     traMADol (ULTRAM) 50 MG tablet Take 1 tablet (50 mg total) by mouth every 6 (six) hours. 30 tablet 2    Musculoskeletal: Strength & Muscle Tone: within normal limits Gait & Station: normal Patient leans: N/A   Psychiatric Specialty Exam: Presentation  General Appearance:  Disheveled; Appropriate for  Environment  Eye Contact: Good  Speech: Clear and Coherent  Speech Volume: Normal  Handedness: Right   Mood and Affect  Mood: Anxious; Depressed  Affect: Depressed; Flat; Tearful   Thought Process  Thought Processes: Coherent  Descriptions of Associations:Intact  Orientation:Full (Time, Place and Person)  Thought Content:WDL  History of Schizophrenia/Schizoaffective disorder:No data recorded Duration of Psychotic Symptoms:No data recorded Hallucinations:Hallucinations: None  Ideas of Reference:None  Suicidal Thoughts:Suicidal Thoughts: Yes, Passive SI Passive Intent and/or Plan: Without Plan  Homicidal Thoughts:Homicidal Thoughts: No   Sensorium  Memory: Immediate Good; Recent Good; Remote Good  Judgment: Impaired  Insight: Fair   Chartered certified accountant: Fair  Attention Span: Fair  Recall: Fair  Fund of Knowledge: Fair  Language: Fair   Psychomotor Activity  Psychomotor Activity:  Psychomotor Activity: Normal   Assets  Assets: Communication Skills; Desire for Improvement; Financial Resources/Insurance; Housing; Intimacy; Social Support    Sleep  Sleep: Sleep: Poor   Physical Exam: Physical Exam Vitals and nursing note reviewed. Exam conducted with a chaperone present.  Neurological:     Mental Status: She is alert.  Psychiatric:        Attention and Perception: Attention normal.        Mood and Affect: Mood is depressed. Affect is flat and tearful.        Speech: Speech normal.        Behavior: Behavior is cooperative.        Thought Content: Thought content includes suicidal ideation. Thought content includes suicidal plan.        Cognition and Memory: Memory normal.        Judgment: Judgment is inappropriate.    Review of Systems  Constitutional: Negative.   Psychiatric/Behavioral:  Positive for depression and suicidal ideas. The patient is nervous/anxious.    Blood pressure (!) 114/97, pulse (!)  105, temperature 98.4 F (36.9 C), temperature source Oral, resp. rate 20, height 5' (1.524 m), weight 77.6 kg, SpO2 98%. Body mass index is 33.41 kg/m.    Medical Decision Making: Pt case reviewed and discussed with Dr. Katherine Basset. Patient needs inpatient psychiatric admission for stabilization and treatment. [facility] and CSW notified of disposition. Will restart patient home medications. EDP, RN, and LCSW notified of disposition.     Disposition: Recommend psychiatric Inpatient admission.   Alona Bene, PMHNP 01/16/2023 6:59 PM

## 2023-04-07 ENCOUNTER — Other Ambulatory Visit: Payer: Self-pay | Admitting: Obstetrics and Gynecology

## 2023-04-07 DIAGNOSIS — E049 Nontoxic goiter, unspecified: Secondary | ICD-10-CM

## 2023-04-09 ENCOUNTER — Other Ambulatory Visit: Payer: BC Managed Care – PPO

## 2023-04-14 ENCOUNTER — Other Ambulatory Visit: Payer: BC Managed Care – PPO

## 2023-05-19 ENCOUNTER — Other Ambulatory Visit: Payer: BC Managed Care – PPO

## 2023-06-30 ENCOUNTER — Ambulatory Visit
Admission: RE | Admit: 2023-06-30 | Discharge: 2023-06-30 | Disposition: A | Discharge status: Home / Self Care | Payer: BC Managed Care – PPO | Source: Ambulatory Visit | Attending: Obstetrics and Gynecology | Admitting: Obstetrics and Gynecology

## 2023-06-30 DIAGNOSIS — E049 Nontoxic goiter, unspecified: Secondary | ICD-10-CM

## 2023-08-05 ENCOUNTER — Other Ambulatory Visit: Payer: Self-pay | Admitting: Otolaryngology

## 2023-08-05 DIAGNOSIS — E042 Nontoxic multinodular goiter: Secondary | ICD-10-CM

## 2023-08-22 ENCOUNTER — Inpatient Hospital Stay: Admission: RE | Admit: 2023-08-22 | Payer: BC Managed Care – PPO | Source: Ambulatory Visit

## 2023-08-25 ENCOUNTER — Encounter (HOSPITAL_COMMUNITY): Payer: Self-pay

## 2023-08-25 ENCOUNTER — Inpatient Hospital Stay (HOSPITAL_COMMUNITY)
Admission: AD | Admit: 2023-08-25 | Discharge: 2023-08-25 | Disposition: A | Discharge status: Home / Self Care | Payer: BC Managed Care – PPO | Attending: Obstetrics and Gynecology | Admitting: Obstetrics and Gynecology

## 2023-08-25 ENCOUNTER — Other Ambulatory Visit: Payer: Self-pay

## 2023-08-25 ENCOUNTER — Inpatient Hospital Stay (HOSPITAL_COMMUNITY): Payer: BC Managed Care – PPO

## 2023-08-25 DIAGNOSIS — O26891 Other specified pregnancy related conditions, first trimester: Secondary | ICD-10-CM | POA: Insufficient documentation

## 2023-08-25 DIAGNOSIS — R102 Pelvic and perineal pain: Secondary | ICD-10-CM | POA: Diagnosis not present

## 2023-08-25 DIAGNOSIS — R109 Unspecified abdominal pain: Secondary | ICD-10-CM | POA: Insufficient documentation

## 2023-08-25 DIAGNOSIS — Z3A01 Less than 8 weeks gestation of pregnancy: Secondary | ICD-10-CM | POA: Diagnosis not present

## 2023-08-25 DIAGNOSIS — N83201 Unspecified ovarian cyst, right side: Secondary | ICD-10-CM | POA: Diagnosis not present

## 2023-08-25 DIAGNOSIS — O09291 Supervision of pregnancy with other poor reproductive or obstetric history, first trimester: Secondary | ICD-10-CM | POA: Diagnosis not present

## 2023-08-25 DIAGNOSIS — O3481 Maternal care for other abnormalities of pelvic organs, first trimester: Secondary | ICD-10-CM | POA: Insufficient documentation

## 2023-08-25 LAB — COMPREHENSIVE METABOLIC PANEL
ALT: 24 U/L (ref 0–44)
AST: 23 U/L (ref 15–41)
Albumin: 3.7 g/dL (ref 3.5–5.0)
Alkaline Phosphatase: 52 U/L (ref 38–126)
Anion gap: 12 (ref 5–15)
BUN: 8 mg/dL (ref 6–20)
CO2: 24 mmol/L (ref 22–32)
Calcium: 9.7 mg/dL (ref 8.9–10.3)
Chloride: 102 mmol/L (ref 98–111)
Creatinine, Ser: 0.62 mg/dL (ref 0.44–1.00)
GFR, Estimated: >60 mL/min (ref >60)
Glucose, Bld: 98 mg/dL (ref 70–99)
Potassium: 4.1 mmol/L (ref 3.5–5.1)
Sodium: 138 mmol/L (ref 135–145)
Total Bilirubin: 0.5 mg/dL (ref 0.0–1.2)
Total Protein: 6.2 g/dL — ABNORMAL LOW (ref 6.5–8.1)

## 2023-08-25 LAB — URINALYSIS, ROUTINE W REFLEX MICROSCOPIC
Bilirubin Urine: NEGATIVE
Glucose, UA: NEGATIVE mg/dL
Hgb urine dipstick: NEGATIVE
Ketones, ur: NEGATIVE mg/dL
Leukocytes,Ua: NEGATIVE
Nitrite: NEGATIVE
Protein, ur: NEGATIVE mg/dL
Specific Gravity, Urine: 1.019 (ref 1.005–1.030)
pH: 5 (ref 5.0–8.0)

## 2023-08-25 LAB — CBC
HCT: 39.3 % (ref 36.0–46.0)
Hemoglobin: 13.6 g/dL (ref 12.0–15.0)
MCH: 30.4 pg (ref 26.0–34.0)
MCHC: 34.6 g/dL (ref 30.0–36.0)
MCV: 87.7 fL (ref 80.0–100.0)
Platelets: 240 10*3/uL (ref 150–400)
RBC: 4.48 MIL/uL (ref 3.87–5.11)
RDW: 13.6 % (ref 11.5–15.5)
WBC: 8.8 10*3/uL (ref 4.0–10.5)
nRBC: 0 % (ref 0.0–0.2)

## 2023-08-25 LAB — HCG, QUANTITATIVE, PREGNANCY: hCG, Beta Chain, Quant, S: 3951 m[IU]/mL — ABNORMAL HIGH (ref <5)

## 2023-08-25 NOTE — ED Triage Notes (Signed)
 Pt was sent by OB for an UC since she has a high risk pregnancy. Pt states she is [redacted] weeks pregnant.

## 2023-08-25 NOTE — MAU Note (Signed)
.  Christine Greene is a 38 y.o. at Unknown here in MAU reporting: Intermittent abdominal pain for the past few days. Denies VB. Unsure of LMP. Reports she is [redacted] weeks pregnant. Reports nausea. Denies vomiting.   Last Tuesday her hcg was 433.  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  LMP: 07/26/2023 approximately  Onset of complaint: A few days  Pain score: 1/10 lower abdomen  FHT: n/a Lab orders placed from triage: none

## 2023-08-25 NOTE — ED Provider Triage Note (Signed)
 Emergency Medicine Provider Triage Evaluation Note  Christine Greene , a 38 y.o. female  was evaluated in triage.  Pt is here requesting routine ultrasound.  Reports that her OB/GYN office is currently short staffed in the center here for routine ultrasound.  The patient reports that she is a high risk pregnancy.  She is G4 P12.  Reports that she has a date miscarriages in the past.  She is unsure of her last menstrual cycle.  States she is [redacted] weeks pregnant.  Reports that she has been having off-and-on abdominal pain for the last few days but denies any vaginal bleeding, spotting.  Endorsing nausea without vomiting.  Denies any medical concerns.  Review of Systems  Positive:  Negative:   Physical Exam  BP (!) 144/96 (BP Location: Right Arm)   Pulse (!) 106   Temp 98.4 F (36.9 C) (Oral)   Resp 15   Ht 5' (1.524 m)   Wt 77.6 kg   SpO2 97%   BMI 33.41 kg/m  Gen:   Awake, no distress   Resp:  Normal effort  MSK:   Moves extremities without difficulty  Other:    Medical Decision Making  Medically screening exam initiated at 3:00 PM.  Appropriate orders placed.  Christine Greene was informed that the remainder of the evaluation will be completed by another provider, this initial triage assessment does not replace that evaluation, and the importance of remaining in the ED until their evaluation is complete.     Al Decant, PA-C 08/25/23 1500

## 2023-08-25 NOTE — MAU Provider Note (Signed)
 History     [id number]  Arrival date and time: 08/25/23 1436    Chief Complaint  Patient presents with   Abdominal Pain     HPI Christine Greene is a 38 y.o. at [redacted]w[redacted]d by LMP with PMHx notable for one prior ectopic, multiple SAB, who presents for abdominal pain.   Patient reports she was seen in office last week and had hcg level of 433 Has had some intermittent cramping, denies vaginal bleeding Cramping is mostly on L side She reports she is a high risk pregnancy and wants to be sure she does not have an ectopic   --/--/B NEG (06/19 0045)  OB History     Gravida  12   Para  3   Term  2   Preterm  1   AB  8   Living  4      SAB  7   IAB  0   Ectopic  1   Multiple  1   Live Births  4           Past Medical History:  Diagnosis Date   Anxiety    history of Anemia    PONV (postoperative nausea and vomiting)    Preterm labor     Past Surgical History:  Procedure Laterality Date   APPENDECTOMY  2008   DILATION AND CURETTAGE OF UTERUS     DILATION AND EVACUATION N/A 12/16/2022   Procedure: DILATATION AND EVACUATION;  Surgeon: Carrington Clamp, MD;  Location: [facility];  Service: Gynecology;  Laterality: N/A;   DILATION AND EVACUATION N/A 12/18/2022   Procedure: DILATATION AND EVACUATION;  Surgeon: Carrington Clamp, MD;  Location: [facility];  Service: Gynecology;  Laterality: N/A;   ECTOPIC PREGNANCY SURGERY  2007   LAPAROSCOPY FOR ECTOPIC PREGNANCY     VAGINAL DELIVERY  11/05/2011   Procedure: VAGINAL DELIVERY;  Surgeon: Levi Aland, MD;  Location: [facility];  Service: Gynecology;  Laterality: N/A;    Family History  Problem Relation Age of Onset   Diabetes Father     Social History   Socioeconomic History   Marital status: Married    Spouse name: Not on file   Number of children: Not on file   Years of education: Not on file   Highest education level: Not on file  Occupational History   Not on file  Tobacco Use    Smoking status: Never   Smokeless tobacco: Never  Vaping Use   Vaping status: Never Used  Substance and Sexual Activity   Alcohol use: No   Drug use: No   Sexual activity: Not Currently    Birth control/protection: None  Other Topics Concern   Not on file  Social History Narrative   Not on file   Social Drivers of Health   Financial Resource Strain: Not on file  Food Insecurity: No Food Insecurity (12/18/2022)   Hunger Vital Sign    Worried About Running Out of Food in the Last Year: Never true    Ran Out of Food in the Last Year: Never true  Transportation Needs: No Transportation Needs (12/18/2022)   PRAPARE - Administrator, Civil Service (Medical): No    Lack of Transportation (Non-Medical): No  Physical Activity: Not on file  Stress: Not on file  Social Connections: Not on file  Intimate Partner Violence: Not At Risk (12/18/2022)   Humiliation, Afraid, Rape, and Kick questionnaire    Fear of Current or Ex-Partner:  No    Emotionally Abused: No    Physically Abused: No    Sexually Abused: No    Allergies  Allergen Reactions   Chlorhexidine Gluconate Itching    No current facility-administered medications on file prior to encounter.   Current Outpatient Medications on File Prior to Encounter  Medication Sig Dispense Refill   escitalopram (LEXAPRO) 20 MG tablet Take 20 mg by mouth at bedtime.     ibuprofen (ADVIL) 800 MG tablet Take 1 tablet (800 mg total) by mouth 3 (three) times daily. 30 tablet 0   lidocaine (XYLOCAINE) 2 % jelly Apply 1 Application topically 4 (four) times daily as needed (for vaginal pain; can apply to vulva and inside of vagina). 20 mL 2   ondansetron (ZOFRAN) 4 MG tablet Take 1 tablet (4 mg total) by mouth every 6 (six) hours as needed for nausea. 20 tablet 0   Prenatal Vit-Fe Fumarate-FA (MULTIVITAMIN-PRENATAL) 27-0.8 MG TABS tablet Take 1 tablet by mouth daily at 12 noon.     traMADol (ULTRAM) 50 MG tablet Take 1 tablet (50 mg  total) by mouth every 6 (six) hours. 30 tablet 2     ROS Pertinent positives and negative per HPI, all others reviewed and negative  Physical Exam   BP 129/80 (BP Location: Right Arm)   Pulse (!) 102   Temp 98.4 F (36.9 C) (Oral)   Resp 16   Ht 5' (1.524 m)   Wt 77.6 kg   LMP 07/26/2023 (Exact Date)   SpO2 97%   BMI 33.41 kg/m   Patient Vitals for the past 24 hrs:  BP Temp Temp src Pulse Resp SpO2 Height Weight  08/25/23 1734 129/80 98.4 F (36.9 C) Oral (!) 102 16 -- -- --  08/25/23 1443 -- -- -- -- -- -- 5' (1.524 m) 77.6 kg  08/25/23 1442 (!) 144/96 98.4 F (36.9 C) Oral (!) 106 15 97 % -- --    Physical Exam Vitals reviewed.  Constitutional:      General: She is not in acute distress.    Appearance: She is well-developed. She is not diaphoretic.  Eyes:     General: No scleral icterus. Pulmonary:     Effort: Pulmonary effort is normal. No respiratory distress.  Abdominal:     General: There is no distension.     Palpations: Abdomen is soft.     Tenderness: There is no abdominal tenderness. There is no guarding or rebound.  Skin:    General: Skin is warm and dry.  Neurological:     Mental Status: She is alert.     Coordination: Coordination normal.      Cervical Exam    Bedside Ultrasound Pt informed that the ultrasound is considered a limited OB ultrasound and is not intended to be a complete ultrasound exam.  Patient also informed that the ultrasound is not being completed with the intent of assessing for fetal or placental anomalies or any pelvic abnormalities.  Explained that the purpose of today's ultrasound is to assess for  viability.  Patient acknowledges the purpose of the exam and the limitations of the study.      My interpretation: no definite intrauterine structures seen. R adnexal cyst seen.    Labs Results for orders placed or performed during the hospital encounter of 08/25/23 (from the past 24 hours)  CBC     Status: None    Collection Time: 08/25/23  3:39 PM  Result Value Ref Range   WBC 8.8  4.0 - 10.5 K/uL   RBC 4.48 3.87 - 5.11 MIL/uL   Hemoglobin 13.6 12.0 - 15.0 g/dL   HCT 47.8 29.5 - 62.1 %   MCV 87.7 80.0 - 100.0 fL   MCH 30.4 26.0 - 34.0 pg   MCHC 34.6 30.0 - 36.0 g/dL   RDW 30.8 65.7 - 84.6 %   Platelets 240 150 - 400 K/uL   nRBC 0.0 0.0 - 0.2 %  Comprehensive metabolic panel     Status: Abnormal   Collection Time: 08/25/23  3:39 PM  Result Value Ref Range   Sodium 138 135 - 145 mmol/L   Potassium 4.1 3.5 - 5.1 mmol/L   Chloride 102 98 - 111 mmol/L   CO2 24 22 - 32 mmol/L   Glucose, Bld 98 70 - 99 mg/dL   BUN 8 6 - 20 mg/dL   Creatinine, Ser 9.62 0.44 - 1.00 mg/dL   Calcium 9.7 8.9 - 95.2 mg/dL   Total Protein 6.2 (L) 6.5 - 8.1 g/dL   Albumin 3.7 3.5 - 5.0 g/dL   AST 23 15 - 41 U/L   ALT 24 0 - 44 U/L   Alkaline Phosphatase 52 38 - 126 U/L   Total Bilirubin 0.5 0.0 - 1.2 mg/dL   GFR, Estimated >84 >13 mL/min   Anion gap 12 5 - 15  hCG, quantitative, pregnancy     Status: Abnormal   Collection Time: 08/25/23  3:39 PM  Result Value Ref Range   hCG, Beta Chain, Quant, S 3,951 (H) <5 mIU/mL  Urinalysis, Routine w reflex microscopic -Urine, Clean Catch     Status: Abnormal   Collection Time: 08/25/23  4:09 PM  Result Value Ref Range   Color, Urine YELLOW YELLOW   APPearance HAZY (A) CLEAR   Specific Gravity, Urine 1.019 1.005 - 1.030   pH 5.0 5.0 - 8.0   Glucose, UA NEGATIVE NEGATIVE mg/dL   Hgb urine dipstick NEGATIVE NEGATIVE   Bilirubin Urine NEGATIVE NEGATIVE   Ketones, ur NEGATIVE NEGATIVE mg/dL   Protein, ur NEGATIVE NEGATIVE mg/dL   Nitrite NEGATIVE NEGATIVE   Leukocytes,Ua NEGATIVE NEGATIVE    Imaging US OB LESS THAN 14 WEEKS WITH OB TRANSVAGINAL Result Date: 08/25/2023 CLINICAL DATA:  Beta HCG 3,951, cramping EXAM: OBSTETRIC <14 WK [place] AND TRANSVAGINAL OB US TECHNIQUE: Both transabdominal and transvaginal ultrasound examinations were performed for complete evaluation of  the gestation as well as the maternal uterus, adnexal regions, and pelvic cul-de-sac. Transvaginal technique was performed to assess early pregnancy. COMPARISON:  None Available. FINDINGS: Intrauterine gestational sac: Single. Yolk sac:  Not Visualized. Embryo:  Not Visualized. Cardiac Activity: Not Visualized. MSD: 4.0 mm   5 w   1 d Subchorionic hemorrhage:  None visualized. Maternal uterus/adnexae: Right ovary measures 4.3 x 3.6 x 3.7 cm. Complex right ovarian cyst measuring 2.6 x 2.4 x 2.4 cm demonstrate internal debris, and could reflect a corpus luteal cyst. Left ovary measures 2.5 x 2.3 x 2.0 cm. Trace pelvic free fluid. IMPRESSION: 1. Probable early intrauterine gestational sac, but no yolk sac, fetal pole, or cardiac activity yet visualized. Recommend follow-up quantitative B-HCG levels and follow-up US in 14 days to assess viability. This recommendation follows SRU consensus guidelines: Diagnostic Criteria for Nonviable Pregnancy Early in the First Trimester. Malva Limes Med 2013; 244:0102-72. 2. Minimally complex right ovarian cyst measuring 2.6 cm, likely corpus luteal cyst. 3. Trace pelvic free fluid. Electronically Signed   By: Sharlet Salina M.D.   On:  08/25/2023 20:50    MAU Course  Procedures Lab Orders         CBC         Comprehensive metabolic panel         Urinalysis, Routine w reflex microscopic -Urine, Clean Catch         hCG, quantitative, pregnancy    No orders of the defined types were placed in this encounter.  Imaging Orders         US OB LESS THAN 14 WEEKS WITH OB TRANSVAGINAL     MDM Moderate (Level 3-4)  Assessment and Plan  #Abdominal pain in pregnancy, first trimester #[redacted] weeks gestation of pregnancy Final radiology read not yet back. Reviewed images personally, they show normal L adnexa, normal R adnexa with corpus luteum cyst, and small intrauterine gestational sac. Implantation is eccentric in the R fundus. Reviewed images with patient and discussed I do not see  evidence of adnexal ectopic but there is incredibly small chance of cornual pregnancy. Will notify her of final radiology read if it diverges significantly from my read but reiterated that I have low suspicion and suspect this is a normal IUP. I emphasized return precautions with patient and also strongly recommended f/u US with [facility] for repeat US in 7-10 days.     Dispo: discharged to home in stable condition    Venora Maples, MD/MPH 08/25/23 8:59 PM  Allergies as of 08/25/2023       Reactions   Chlorhexidine Gluconate Itching        Medication List     STOP taking these medications    ibuprofen 800 MG tablet Commonly known as: ADVIL       TAKE these medications    escitalopram 20 MG tablet Commonly known as: LEXAPRO Take 20 mg by mouth at bedtime.   lidocaine 2 % jelly Commonly known as: XYLOCAINE Apply 1 Application topically 4 (four) times daily as needed (for vaginal pain; can apply to vulva and inside of vagina).   multivitamin-prenatal 27-0.8 MG Tabs tablet Take 1 tablet by mouth daily at 12 noon.   ondansetron 4 MG tablet Commonly known as: ZOFRAN Take 1 tablet (4 mg total) by mouth every 6 (six) hours as needed for nausea.   traMADol 50 MG tablet Commonly known as: ULTRAM Take 1 tablet (50 mg total) by mouth every 6 (six) hours.

## 2023-08-25 NOTE — Discharge Instructions (Signed)
 You were seen in the MAU for abdominal pain. We did an ultrasound that showed no evidence of an ectopic pregnancy, and a very small gestational sac in your uterus. The final radiology read is not back yet, so if there are any major changes then we will contact you. Return to the MAU immediately if you have heavy vaginal bleeding or severe abdominal pain.

## 2023-09-12 ENCOUNTER — Ambulatory Visit
Admission: RE | Admit: 2023-09-12 | Discharge: 2023-09-12 | Disposition: A | Discharge status: Home / Self Care | Payer: BC Managed Care – PPO | Source: Ambulatory Visit | Attending: Otolaryngology | Admitting: Otolaryngology

## 2023-09-12 ENCOUNTER — Other Ambulatory Visit (HOSPITAL_COMMUNITY)
Admission: RE | Admit: 2023-09-12 | Discharge: 2023-09-12 | Disposition: A | Discharge status: Home / Self Care | Source: Ambulatory Visit | Attending: Otolaryngology | Admitting: Otolaryngology

## 2023-09-12 DIAGNOSIS — E042 Nontoxic multinodular goiter: Secondary | ICD-10-CM | POA: Diagnosis present

## 2023-09-16 LAB — CYTOLOGY - NON PAP

## 2023-09-25 ENCOUNTER — Inpatient Hospital Stay (HOSPITAL_COMMUNITY)

## 2023-09-25 ENCOUNTER — Inpatient Hospital Stay (HOSPITAL_COMMUNITY)
Admission: AD | Admit: 2023-09-25 | Discharge: 2023-09-25 | Disposition: A | Discharge status: Home / Self Care | Attending: Obstetrics and Gynecology | Admitting: Obstetrics and Gynecology

## 2023-09-25 ENCOUNTER — Encounter (HOSPITAL_COMMUNITY): Payer: Self-pay | Admitting: Obstetrics and Gynecology

## 2023-09-25 DIAGNOSIS — Z3A08 8 weeks gestation of pregnancy: Secondary | ICD-10-CM

## 2023-09-25 DIAGNOSIS — O26851 Spotting complicating pregnancy, first trimester: Secondary | ICD-10-CM | POA: Diagnosis present

## 2023-09-25 DIAGNOSIS — Z3A09 9 weeks gestation of pregnancy: Secondary | ICD-10-CM | POA: Insufficient documentation

## 2023-09-25 DIAGNOSIS — O219 Vomiting of pregnancy, unspecified: Secondary | ICD-10-CM | POA: Insufficient documentation

## 2023-09-25 DIAGNOSIS — O209 Hemorrhage in early pregnancy, unspecified: Secondary | ICD-10-CM | POA: Diagnosis not present

## 2023-09-25 LAB — URINALYSIS, ROUTINE W REFLEX MICROSCOPIC
Bilirubin Urine: NEGATIVE
Glucose, UA: NEGATIVE mg/dL
Ketones, ur: NEGATIVE mg/dL
Leukocytes,Ua: NEGATIVE
Nitrite: NEGATIVE
Protein, ur: 30 mg/dL — AB
RBC / HPF: >50 RBC/hpf (ref 0–5)
Specific Gravity, Urine: 1.028 (ref 1.005–1.030)
pH: 5 (ref 5.0–8.0)

## 2023-09-25 MED ORDER — ONDANSETRON 4 MG PO TBDP
8.0000 mg | ORAL_TABLET | Freq: Once | ORAL | Status: AC
  Administered 2023-09-25: 8 mg via ORAL
  Filled 2023-09-25: qty 2

## 2023-09-25 MED ORDER — SCOPOLAMINE 1 MG/3DAYS TD PT72
1.0000 | MEDICATED_PATCH | TRANSDERMAL | Status: DC
  Administered 2023-09-25: 1.5 mg via TRANSDERMAL
  Filled 2023-09-25: qty 1

## 2023-09-25 MED ORDER — METOCLOPRAMIDE HCL 10 MG PO TABS: 10.0000 mg | ORAL_TABLET | Freq: Four times a day (QID) | ORAL | 0 refills | Status: AC | PRN

## 2023-09-25 MED ORDER — SCOPOLAMINE 1 MG/3DAYS TD PT72
1.0000 | MEDICATED_PATCH | TRANSDERMAL | 0 refills | Status: AC
Start: 2023-09-28 — End: ?

## 2023-09-25 NOTE — MAU Provider Note (Signed)
 Chief Complaint:  Vaginal Bleeding, Emesis, and Nausea   HPI    Christine Greene is a 38 y.o. [id number] at [redacted]w[redacted]d who presents to maternity admissions reporting N/V that has been ongoing x 3 weeks. She is taking Diclegis and Zofran that are not working and she reports that she had been to urgent care for dehydration and received IVF on 09/23/23. She reports unable to tolerate PO Food fluids but denies any fever, sick contacts, diarrhea.   She is also concerned about some vaginal bleeding that she has been having intermittently x 2 weeks. No c/o cramping and reports the vaginal bleeding is usually after using the BR when she wipes. Offers no c/o dysuria or constipation. Denies any vaginal discharge at this time.   Pregnancy Course: Christine Greene   Past Medical History:  Diagnosis Date   Anxiety    history of Anemia    PONV (postoperative nausea and vomiting)    Preterm labor    OB History  Gravida Para Term Preterm AB Living  12 3 2 1 8 4   SAB IAB Ectopic Multiple Live Births  7 0 1 1 4     # Outcome Date GA Lbr Len/2nd Weight Sex Type Anes PTL Lv  12 Current           11 Term 09/05/16 [redacted]w[redacted]d 06:25 / 00:09 2688 g F Vag-Spont EPI  LIV  10A Preterm 11/05/11 [redacted]w[redacted]d 51:40 / 00:07 2310 g M Vag-Spont EPI, Local  LIV  10B Preterm 11/05/11 [redacted]w[redacted]d 51:40 / 00:09 1910 g F Vag-Spont Local, EPI  LIV  9 Term 07/08/06    F Vag-Spont   LIV  8 SAB           7 SAB           6 SAB           5 SAB           4 SAB           3 SAB           2 SAB           1 Ectopic            Past Surgical History:  Procedure Laterality Date   APPENDECTOMY  2008   DILATION AND CURETTAGE OF UTERUS     DILATION AND EVACUATION N/A 12/16/2022   Procedure: DILATATION AND EVACUATION;  Surgeon: Christine Clamp, MD;  Location: [facility];  Service: Gynecology;  Laterality: N/A;   DILATION AND EVACUATION N/A 12/18/2022   Procedure: DILATATION AND EVACUATION;  Surgeon: Christine Clamp, MD;  Location: [facility];   Service: Gynecology;  Laterality: N/A;   ECTOPIC PREGNANCY SURGERY  2007   LAPAROSCOPY FOR ECTOPIC PREGNANCY     VAGINAL DELIVERY  11/05/2011   Procedure: VAGINAL DELIVERY;  Surgeon: Christine Aland, MD;  Location: [facility];  Service: Gynecology;  Laterality: N/A;   Family History  Problem Relation Age of Onset   Healthy Mother    Diabetes Father    Social History   Tobacco Use   Smoking status: Never   Smokeless tobacco: Never  Vaping Use   Vaping status: Never Used  Substance Use Topics   Alcohol use: No   Drug use: No   Allergies  Allergen Reactions   Chlorhexidine Gluconate Itching   Medications Prior to Admission  Medication Sig Dispense Refill Last Dose/Taking   DULoxetine (CYMBALTA) 60 MG capsule Take 60 mg by mouth  daily.   09/25/2023   ondansetron (ZOFRAN) 4 MG tablet Take 1 tablet (4 mg total) by mouth every 6 (six) hours as needed for nausea. 20 tablet 0 09/25/2023 at  9:30 AM   Prenatal Vit-Fe Fumarate-FA (MULTIVITAMIN-PRENATAL) 27-0.8 MG TABS tablet Take 1 tablet by mouth daily at 12 noon.   09/24/2023   escitalopram (LEXAPRO) 20 MG tablet Take 20 mg by mouth at bedtime.      lidocaine (XYLOCAINE) 2 % jelly Apply 1 Application topically 4 (four) times daily as needed (for vaginal pain; can apply to vulva and inside of vagina). 20 mL 2    traMADol (ULTRAM) 50 MG tablet Take 1 tablet (50 mg total) by mouth every 6 (six) hours. 30 tablet 2     I have reviewed patient's Past Medical Hx, Surgical Hx, Family Hx, Social Hx, medications and allergies.   ROS  Pertinent items noted in HPI and remainder of comprehensive ROS otherwise negative.   PHYSICAL EXAM  Patient Vitals for the past 24 hrs:  BP Temp Pulse Resp SpO2 Height Weight  09/25/23 1301 127/81 -- 97 19 96 % -- --  09/25/23 1241 118/83 98.4 F (36.9 C) (!) 103 18 -- 5' (1.524 m) 87.1 kg    Constitutional: Well-developed, well-nourished female in no acute distress.  Cardiovascular: normal rate & rhythm, warm  and well-perfused Respiratory: normal effort, no problems with respiration noted GI: Abd soft, non-tender, gravid MS: Extremities nontender, no edema, normal ROM Neurologic: Alert and oriented x 4.  GU: no CVA tenderness Pelvic: NEFG, physiologic discharge, no blood, cervix clean.      Fetal Tracing: Via ultrasound at 167    Labs: Results for orders placed or performed during the hospital encounter of 09/25/23 (from the past 24 hours)  Urinalysis, Routine w reflex microscopic -Urine, Clean Catch     Status: Abnormal   Collection Time: 09/25/23 12:52 PM  Result Value Ref Range   Color, Urine AMBER (A) YELLOW   APPearance CLOUDY (A) CLEAR   Specific Gravity, Urine 1.028 1.005 - 1.030   pH 5.0 5.0 - 8.0   Glucose, UA NEGATIVE NEGATIVE mg/dL   Hgb urine dipstick LARGE (A) NEGATIVE   Bilirubin Urine NEGATIVE NEGATIVE   Ketones, ur NEGATIVE NEGATIVE mg/dL   Protein, ur 30 (A) NEGATIVE mg/dL   Nitrite NEGATIVE NEGATIVE   Leukocytes,Ua NEGATIVE NEGATIVE   RBC / HPF >50 0 - 5 RBC/hpf   WBC, UA 11-20 0 - 5 WBC/hpf   Bacteria, UA FEW (A) NONE SEEN   Squamous Epithelial / HPF 6-10 0 - 5 /HPF   Mucus PRESENT    Hyaline Casts, UA PRESENT     Imaging:  No results found.  MDM & MAU COURSE  MDM:  HIGH  -ultrasound -UA - Antiemetics ordered  SIUP ~ [redacted]w[redacted]d ( Images reviewed)   - Patient reassessed at 1430 - Feeling better  and observed eating crackers and drinking PO fluids w/o difficulty. -Plan for discharge   I have reviewed the patient chart and performed the physical exam . I have ordered & interpreted the lab results and reviewed and interpreted the ultrasound images Medications ordered as stated below.  A/P as described below.  Counseling and education provided and patient agreeable  with plan as described below. Verbalized understanding.   MAU Course: Orders Placed This Encounter  Procedures   Culture, OB Urine   US OB Comp Less 14 Wks   Urinalysis, Routine w reflex  microscopic -Urine, Clean Catch  Discharge patient Discharge disposition: 01-Home or Self Care; Discharge patient date: 09/25/2023   Meds ordered this encounter  Medications   scopolamine (TRANSDERM-SCOP) 1 MG/3DAYS 1.5 mg   ondansetron (ZOFRAN-ODT) disintegrating tablet 8 mg   scopolamine (TRANSDERM-SCOP) 1 MG/3DAYS    Sig: Place 1 patch (1.5 mg total) onto the skin every 3 (three) days.    Dispense:  10 patch    Refill:  0    Supervising Provider:   Reva Bores [[id number]]   metoCLOPramide (REGLAN) 10 MG tablet    Sig: Take 1 tablet (10 mg total) by mouth every 6 (six) hours as needed for nausea or vomiting.    Dispense:  20 tablet    Refill:  0    Supervising Provider:   Reva Bores [[id number]]    ASSESSMENT   1. Nausea and vomiting in pregnancy   2. Vaginal bleeding affecting early pregnancy   3. [redacted] weeks gestation of pregnancy     PLAN  Discharge home in stable condition with return precautions.   F/U with OB as scheduled  See AVS for full description of verbal and written educational information and instructions provided to the patient upon discharge. She verbalized understanding and agrees with the plan as described above    Allergies as of 09/25/2023       Reactions   Chlorhexidine Gluconate Itching        Medication List     STOP taking these medications    traMADol 50 MG tablet Commonly known as: ULTRAM       TAKE these medications    DULoxetine 60 MG capsule Commonly known as: CYMBALTA Take 60 mg by mouth daily.   escitalopram 20 MG tablet Commonly known as: LEXAPRO Take 20 mg by mouth at bedtime.   lidocaine 2 % jelly Commonly known as: XYLOCAINE Apply 1 Application topically 4 (four) times daily as needed (for vaginal pain; can apply to vulva and inside of vagina).   metoCLOPramide 10 MG tablet Commonly known as: REGLAN Take 1 tablet (10 mg total) by mouth every 6 (six) hours as needed for nausea or vomiting.   multivitamin-prenatal 27-0.8  MG Tabs tablet Take 1 tablet by mouth daily at 12 noon.   ondansetron 4 MG tablet Commonly known as: ZOFRAN Take 1 tablet (4 mg total) by mouth every 6 (six) hours as needed for nausea.   scopolamine 1 MG/3DAYS Commonly known as: TRANSDERM-SCOP Place 1 patch (1.5 mg total) onto the skin every 3 (three) days. Start taking on: September 28, 2023        Marcell Barlow, MSN, [facility], [facility] [employer]

## 2023-09-25 NOTE — MAU Note (Signed)
.  Christine Greene is a 38 y.o. at [redacted]w[redacted]d here in MAU reporting: has had N/v for several day on diclegis  and zofran not helping. Had gone to Urgent care for fluids on Tuesday. Still feeling sick and dehydrated today. Also has been dx with Subchorionic hematoma  and is reporting some pink to brown bleeding that she has had on and off for about 2 weeks. Denies any pain or cramping.   LMP:  Onset of complaint: 1 week Pain score: 0 Vitals:   09/25/23 1241  BP: 118/83  Pulse: (!) 103  Resp: 18  Temp: 98.4 F (36.9 C)     FHT: n/a  Lab orders placed from triage: u/a

## 2023-09-26 LAB — CULTURE, OB URINE: Culture: <10000 — AB

## 2023-10-03 ENCOUNTER — Other Ambulatory Visit: Payer: Self-pay | Admitting: Obstetrics and Gynecology

## 2023-10-03 ENCOUNTER — Inpatient Hospital Stay (HOSPITAL_COMMUNITY)
Admission: AD | Admit: 2023-10-03 | Discharge: 2023-10-03 | Disposition: A | Discharge status: Home / Self Care | Payer: Self-pay | Attending: Obstetrics and Gynecology | Admitting: Obstetrics and Gynecology

## 2023-10-03 DIAGNOSIS — O210 Mild hyperemesis gravidarum: Secondary | ICD-10-CM | POA: Diagnosis present

## 2023-10-03 DIAGNOSIS — R1110 Vomiting, unspecified: Secondary | ICD-10-CM | POA: Insufficient documentation

## 2023-10-03 DIAGNOSIS — Z3A10 10 weeks gestation of pregnancy: Secondary | ICD-10-CM

## 2023-10-03 DIAGNOSIS — Z3A01 Less than 8 weeks gestation of pregnancy: Secondary | ICD-10-CM | POA: Insufficient documentation

## 2023-10-03 LAB — COMPREHENSIVE METABOLIC PANEL WITH GFR
ALT: 21 U/L (ref 0–44)
AST: 17 U/L (ref 15–41)
Albumin: 3.1 g/dL — ABNORMAL LOW (ref 3.5–5.0)
Alkaline Phosphatase: 41 U/L (ref 38–126)
Anion gap: 10 (ref 5–15)
BUN: 6 mg/dL (ref 6–20)
CO2: 23 mmol/L (ref 22–32)
Calcium: 8.7 mg/dL — ABNORMAL LOW (ref 8.9–10.3)
Chloride: 103 mmol/L (ref 98–111)
Creatinine, Ser: 0.62 mg/dL (ref 0.44–1.00)
GFR, Estimated: >60 mL/min (ref >60)
Glucose, Bld: 92 mg/dL (ref 70–99)
Potassium: 3.4 mmol/L — ABNORMAL LOW (ref 3.5–5.1)
Sodium: 136 mmol/L (ref 135–145)
Total Bilirubin: 0.2 mg/dL (ref 0.0–1.2)
Total Protein: 6 g/dL — ABNORMAL LOW (ref 6.5–8.1)

## 2023-10-03 LAB — URINALYSIS, ROUTINE W REFLEX MICROSCOPIC
Bilirubin Urine: NEGATIVE
Glucose, UA: NEGATIVE mg/dL
Hgb urine dipstick: NEGATIVE
Ketones, ur: NEGATIVE mg/dL
Leukocytes,Ua: NEGATIVE
Nitrite: NEGATIVE
Protein, ur: NEGATIVE mg/dL
Specific Gravity, Urine: 1.019 (ref 1.005–1.030)
pH: 6 (ref 5.0–8.0)

## 2023-10-03 MED ORDER — ONDANSETRON HCL 4 MG/2ML IJ SOLN
4.0000 mg | Freq: Once | INTRAMUSCULAR | Status: AC
  Administered 2023-10-03: 4 mg via INTRAVENOUS
  Filled 2023-10-03: qty 2

## 2023-10-03 MED ORDER — PANTOPRAZOLE SODIUM 40 MG IV SOLR
40.0000 mg | Freq: Once | INTRAVENOUS | Status: AC
  Administered 2023-10-03: 40 mg via INTRAVENOUS
  Filled 2023-10-03: qty 10

## 2023-10-03 MED ORDER — SODIUM CHLORIDE 0.9 % IV SOLN
12.5000 mg | Freq: Once | INTRAVENOUS | Status: AC
  Administered 2023-10-03: 12.5 mg via INTRAVENOUS
  Filled 2023-10-03: qty 0.5

## 2023-10-03 MED ORDER — PANTOPRAZOLE SODIUM 20 MG PO TBEC: 20.0000 mg | DELAYED_RELEASE_TABLET | Freq: Every day | ORAL | 2 refills | Status: AC

## 2023-10-03 MED ORDER — SCOPOLAMINE 1 MG/3DAYS TD PT72: 1.0000 | MEDICATED_PATCH | TRANSDERMAL | Status: DC

## 2023-10-03 MED ORDER — FAMOTIDINE IN NACL 20-0.9 MG/50ML-% IV SOLN: 20.0000 mg | Freq: Once | INTRAVENOUS | Status: DC

## 2023-10-03 NOTE — MAU Note (Signed)
.  Christine Greene is a 38 y.o. at [redacted]w[redacted]d here in MAU reporting: pt has hyperemesis. Sent from [facility] office for fluids. Took diglegisis and Phenergan at 10am without relief.   LMP:  Onset of complaint: ongoing Pain score: 0 Vitals:   10/03/23 1303  BP: 122/79  Pulse: (!) 113  Resp: 18  Temp: 98.6 F (37 C)     FHT: n/a  Lab orders placed from triage: u/a

## 2023-10-03 NOTE — MAU Provider Note (Signed)
 Chief Complaint: Nausea and Emesis   Event Date/Time   First Provider Initiated Contact with Patient 10/03/23 1411      SUBJECTIVE HPI: Christine Greene is a 38 y.o. [id number] at [redacted]w[redacted]d who presents to maternity admissions sent from the office for IV fluids. She reports vomiting several times per day and has trouble keeping down any food or fluids.  Her OB office at [facility] is setting up outpatient IV infusions but these have not started.    HPI  Past Medical History:  Diagnosis Date   Anxiety    history of Anemia    PONV (postoperative nausea and vomiting)    Preterm labor    Past Surgical History:  Procedure Laterality Date   APPENDECTOMY  2008   DILATION AND CURETTAGE OF UTERUS     DILATION AND EVACUATION N/A 12/16/2022   Procedure: DILATATION AND EVACUATION;  Surgeon: Carrington Clamp, MD;  Location: [facility] ;  Service: Gynecology;  Laterality: N/A;   DILATION AND EVACUATION N/A 12/18/2022   Procedure: DILATATION AND EVACUATION;  Surgeon: Carrington Clamp, MD;  Location: [facility];  Service: Gynecology;  Laterality: N/A;   ECTOPIC PREGNANCY SURGERY  2007   LAPAROSCOPY FOR ECTOPIC PREGNANCY     VAGINAL DELIVERY  11/05/2011   Procedure: VAGINAL DELIVERY;  Surgeon: Levi Aland, MD;  Location: [facility];  Service: Gynecology;  Laterality: N/A;   Social History   Socioeconomic History   Marital status: Married    Spouse name: Not on file   Number of children: Not on file   Years of education: Not on file   Highest education level: Not on file  Occupational History   Not on file  Tobacco Use   Smoking status: Never   Smokeless tobacco: Never  Vaping Use   Vaping status: Never Used  Substance and Sexual Activity   Alcohol use: No   Drug use: No   Sexual activity: Not Currently    Birth control/protection: None  Other Topics Concern   Not on file  Social History Narrative   Not on file   Social Drivers of Health   Financial Resource Strain:  Not on file  Food Insecurity: No Food Insecurity (12/18/2022)   Hunger Vital Sign    Worried About Running Out of Food in the Last Year: Never true    Ran Out of Food in the Last Year: Never true  Transportation Needs: No Transportation Needs (12/18/2022)   PRAPARE - Administrator, Civil Service (Medical): No    Lack of Transportation (Non-Medical): No  Physical Activity: Not on file  Stress: Not on file  Social Connections: Not on file  Intimate Partner Violence: Not At Risk (12/18/2022)   Humiliation, Afraid, Rape, and Kick questionnaire    Fear of Current or Ex-Partner: No    Emotionally Abused: No    Physically Abused: No    Sexually Abused: No   No current facility-administered medications on file prior to encounter.   Current Outpatient Medications on File Prior to Encounter  Medication Sig Dispense Refill   DULoxetine (CYMBALTA) 60 MG capsule Take 60 mg by mouth daily.     escitalopram (LEXAPRO) 20 MG tablet Take 20 mg by mouth at bedtime.     ondansetron (ZOFRAN) 4 MG tablet Take 1 tablet (4 mg total) by mouth every 6 (six) hours as needed for nausea. 20 tablet 0   Prenatal Vit-Fe Fumarate-FA (MULTIVITAMIN-PRENATAL) 27-0.8 MG TABS tablet Take 1 tablet by mouth  daily at 12 noon.     scopolamine (TRANSDERM-SCOP) 1 MG/3DAYS Place 1 patch (1.5 mg total) onto the skin every 3 (three) days. 10 patch 0   traZODone (DESYREL) 100 MG tablet Take 100 mg by mouth at bedtime.     lidocaine (XYLOCAINE) 2 % jelly Apply 1 Application topically 4 (four) times daily as needed (for vaginal pain; can apply to vulva and inside of vagina). 20 mL 2   metoCLOPramide (REGLAN) 10 MG tablet Take 1 tablet (10 mg total) by mouth every 6 (six) hours as needed for nausea or vomiting. 20 tablet 0   Allergies  Allergen Reactions   Chlorhexidine Gluconate Itching    ROS:  Review of Systems  Constitutional:  Negative for chills, fatigue and fever.  Respiratory:  Negative for shortness of  breath.   Cardiovascular:  Negative for chest pain.  Gastrointestinal:  Positive for nausea and vomiting.  Genitourinary:  Negative for difficulty urinating, dysuria, flank pain, pelvic pain, vaginal bleeding, vaginal discharge and vaginal pain.  Neurological:  Negative for dizziness and headaches.  Psychiatric/Behavioral: Negative.       I have reviewed patient's Past Medical Hx, Surgical Hx, Family Hx, Social Hx, medications and allergies.   Physical Exam  Patient Vitals for the past 24 hrs:  BP Temp Temp src Pulse Resp SpO2 Height Weight  10/03/23 1334 118/67 98.1 F (36.7 C) Oral (!) 111 17 96 % -- --  10/03/23 1303 122/79 98.6 F (37 C) -- (!) 113 18 -- 5' (1.524 m) 88.9 kg   Constitutional: Well-developed, well-nourished female in no acute distress.  Cardiovascular: normal rate Respiratory: normal effort GI: Abd soft, non-tender. Pos BS x 4 MS: Extremities nontender, no edema, normal ROM Neurologic: Alert and oriented x 4.  GU: Neg CVAT.  PELVIC EXAM: Deferred  FHT 155 by doppler  LAB RESULTS Results for orders placed or performed during the hospital encounter of 10/03/23 (from the past 24 hours)  Urinalysis, Routine w reflex microscopic -Urine, Clean Catch     Status: Abnormal   Collection Time: 10/03/23  2:24 PM  Result Value Ref Range   Color, Urine YELLOW YELLOW   APPearance CLOUDY (A) CLEAR   Specific Gravity, Urine 1.019 1.005 - 1.030   pH 6.0 5.0 - 8.0   Glucose, UA NEGATIVE NEGATIVE mg/dL   Hgb urine dipstick NEGATIVE NEGATIVE   Bilirubin Urine NEGATIVE NEGATIVE   Ketones, ur NEGATIVE NEGATIVE mg/dL   Protein, ur NEGATIVE NEGATIVE mg/dL   Nitrite NEGATIVE NEGATIVE   Leukocytes,Ua NEGATIVE NEGATIVE  Comprehensive metabolic panel with GFR     Status: Abnormal   Collection Time: 10/03/23  5:04 PM  Result Value Ref Range   Sodium 136 135 - 145 mmol/L   Potassium 3.4 (L) 3.5 - 5.1 mmol/L   Chloride 103 98 - 111 mmol/L   CO2 23 22 - 32 mmol/L   Glucose,  Bld 92 70 - 99 mg/dL   BUN 6 6 - 20 mg/dL   Creatinine, Ser 1.61 0.44 - 1.00 mg/dL   Calcium 8.7 (L) 8.9 - 10.3 mg/dL   Total Protein 6.0 (L) 6.5 - 8.1 g/dL   Albumin 3.1 (L) 3.5 - 5.0 g/dL   AST 17 15 - 41 U/L   ALT 21 0 - 44 U/L   Alkaline Phosphatase 41 38 - 126 U/L   Total Bilirubin 0.2 0.0 - 1.2 mg/dL   GFR, Estimated >09 >60 mL/min   Anion gap 10 5 - 15    --/--/  B NEG (06/19 0045)  IMAGING   MAU Management/MDM: Orders Placed This Encounter  Procedures   Urinalysis, Routine w reflex microscopic -Urine, Clean Catch   Comprehensive metabolic panel with GFR   Discharge patient Discharge disposition: 01-Home or Self Care; Discharge patient date: 10/03/2023    Meds ordered this encounter  Medications   promethazine (PHENERGAN) 12.5 mg in sodium chloride 0.9 % 1,000 mL infusion   DISCONTD: famotidine (PEPCID) IVPB 20 mg premix   pantoprazole (PROTONIX) injection 40 mg   ondansetron (ZOFRAN) injection 4 mg   scopolamine (TRANSDERM-SCOP) 1 MG/3DAYS 1.5 mg   pantoprazole (PROTONIX) 20 MG tablet    Sig: Take 1 tablet (20 mg total) by mouth daily.    Dispense:  30 tablet    Refill:  2    Pt with minimal improvement in nausea but no additional emesis after IV fluids, Zofran, Phenergan, and Pepcid given.  U/A without evidence of dehydration. Potassium 3.4, and pt reports she can eat bananas at home so no additional PO potassium prescribed unless condition worsens.  Pt taking Pepcid but has a bad taste in her mouth all the time and feels burning and acid before and after vomiting.  Will add Protonix daily instead of Pepcid.  F/U with OB office and IV infusions as scheduled.  Return to MAU as needed for emergencies.   ASSESSMENT 1. Hyperemesis of pregnancy   2. [redacted] weeks gestation of pregnancy     PLAN Discharge home Allergies as of 10/03/2023       Reactions   Chlorhexidine Gluconate Itching        Medication List     TAKE these medications    DULoxetine 60 MG  capsule Commonly known as: CYMBALTA Take 60 mg by mouth daily.   escitalopram 20 MG tablet Commonly known as: LEXAPRO Take 20 mg by mouth at bedtime.   lidocaine 2 % jelly Commonly known as: XYLOCAINE Apply 1 Application topically 4 (four) times daily as needed (for vaginal pain; can apply to vulva and inside of vagina).   metoCLOPramide 10 MG tablet Commonly known as: REGLAN Take 1 tablet (10 mg total) by mouth every 6 (six) hours as needed for nausea or vomiting.   multivitamin-prenatal 27-0.8 MG Tabs tablet Take 1 tablet by mouth daily at 12 noon.   ondansetron 4 MG tablet Commonly known as: ZOFRAN Take 1 tablet (4 mg total) by mouth every 6 (six) hours as needed for nausea.   pantoprazole 20 MG tablet Commonly known as: PROTONIX Take 1 tablet (20 mg total) by mouth daily.   scopolamine 1 MG/3DAYS Commonly known as: TRANSDERM-SCOP Place 1 patch (1.5 mg total) onto the skin every 3 (three) days.   traZODone 100 MG tablet Commonly known as: DESYREL Take 100 mg by mouth at bedtime.        Follow-up Information     Ob/Gyn, [facility] Follow up.   Why: As scheduled Contact information: [address] [place] [address] [place] [address] [phone]         [facility] Follow up.   Specialty: Obstetrics and Gynecology Why: As needed for emergencies Contact information: [address] [place] [address] [phone]                Sharen Counter Certified Nurse-Midwife 10/03/2023  7:25 PM

## 2023-10-08 ENCOUNTER — Ambulatory Visit

## 2023-10-10 ENCOUNTER — Ambulatory Visit

## 2023-10-15 ENCOUNTER — Other Ambulatory Visit: Payer: Self-pay | Admitting: Obstetrics and Gynecology

## 2023-10-15 ENCOUNTER — Telehealth: Payer: Self-pay

## 2023-10-15 DIAGNOSIS — O09522 Supervision of elderly multigravida, second trimester: Secondary | ICD-10-CM

## 2023-10-26 ENCOUNTER — Encounter (HOSPITAL_COMMUNITY): Payer: Self-pay | Admitting: Obstetrics and Gynecology

## 2023-10-26 ENCOUNTER — Other Ambulatory Visit: Payer: Self-pay

## 2023-10-26 ENCOUNTER — Inpatient Hospital Stay (HOSPITAL_COMMUNITY)
Admission: AD | Admit: 2023-10-26 | Discharge: 2023-10-26 | Disposition: A | Discharge status: Home / Self Care | Attending: Obstetrics and Gynecology | Admitting: Obstetrics and Gynecology

## 2023-10-26 DIAGNOSIS — Z3492 Encounter for supervision of normal pregnancy, unspecified, second trimester: Secondary | ICD-10-CM

## 2023-10-26 DIAGNOSIS — R609 Edema, unspecified: Secondary | ICD-10-CM

## 2023-10-26 DIAGNOSIS — O26892 Other specified pregnancy related conditions, second trimester: Secondary | ICD-10-CM | POA: Diagnosis present

## 2023-10-26 DIAGNOSIS — Z3A14 14 weeks gestation of pregnancy: Secondary | ICD-10-CM | POA: Insufficient documentation

## 2023-10-26 DIAGNOSIS — M7989 Other specified soft tissue disorders: Secondary | ICD-10-CM | POA: Insufficient documentation

## 2023-10-26 LAB — PROTEIN / CREATININE RATIO, URINE
Creatinine, Urine: 170 mg/dL
Protein Creatinine Ratio: 0.07 mg/mg{creat} (ref 0.00–0.15)
Total Protein, Urine: 12 mg/dL

## 2023-10-26 LAB — CBC WITH DIFFERENTIAL/PLATELET
Abs Immature Granulocytes: 0.06 10*3/uL (ref 0.00–0.07)
Basophils Absolute: 0 10*3/uL (ref 0.0–0.1)
Basophils Relative: 0 %
Eosinophils Absolute: 0.1 10*3/uL (ref 0.0–0.5)
Eosinophils Relative: 1 %
HCT: 33.3 % — ABNORMAL LOW (ref 36.0–46.0)
Hemoglobin: 11.5 g/dL — ABNORMAL LOW (ref 12.0–15.0)
Immature Granulocytes: 1 %
Lymphocytes Relative: 23 %
Lymphs Abs: 1.8 10*3/uL (ref 0.7–4.0)
MCH: 30.6 pg (ref 26.0–34.0)
MCHC: 34.5 g/dL (ref 30.0–36.0)
MCV: 88.6 fL (ref 80.0–100.0)
Monocytes Absolute: 0.6 10*3/uL (ref 0.1–1.0)
Monocytes Relative: 7 %
Neutro Abs: 5.5 10*3/uL (ref 1.7–7.7)
Neutrophils Relative %: 68 %
Platelets: 187 10*3/uL (ref 150–400)
RBC: 3.76 MIL/uL — ABNORMAL LOW (ref 3.87–5.11)
RDW: 13.6 % (ref 11.5–15.5)
WBC: 8.1 10*3/uL (ref 4.0–10.5)
nRBC: 0 % (ref 0.0–0.2)

## 2023-10-26 LAB — COMPREHENSIVE METABOLIC PANEL WITH GFR
ALT: 19 U/L (ref 0–44)
AST: 18 U/L (ref 15–41)
Albumin: 2.9 g/dL — ABNORMAL LOW (ref 3.5–5.0)
Alkaline Phosphatase: 48 U/L (ref 38–126)
Anion gap: 11 (ref 5–15)
BUN: 5 mg/dL — ABNORMAL LOW (ref 6–20)
CO2: 24 mmol/L (ref 22–32)
Calcium: 8.7 mg/dL — ABNORMAL LOW (ref 8.9–10.3)
Chloride: 100 mmol/L (ref 98–111)
Creatinine, Ser: 0.53 mg/dL (ref 0.44–1.00)
GFR, Estimated: >60 mL/min (ref >60)
Glucose, Bld: 100 mg/dL — ABNORMAL HIGH (ref 70–99)
Potassium: 3.7 mmol/L (ref 3.5–5.1)
Sodium: 135 mmol/L (ref 135–145)
Total Bilirubin: 0.3 mg/dL (ref 0.0–1.2)
Total Protein: 5.9 g/dL — ABNORMAL LOW (ref 6.5–8.1)

## 2023-10-26 LAB — URINALYSIS, ROUTINE W REFLEX MICROSCOPIC
Bilirubin Urine: NEGATIVE
Glucose, UA: 50 mg/dL — AB
Hgb urine dipstick: NEGATIVE
Ketones, ur: 5 mg/dL — AB
Leukocytes,Ua: NEGATIVE
Nitrite: NEGATIVE
Protein, ur: NEGATIVE mg/dL
Specific Gravity, Urine: 1.027 (ref 1.005–1.030)
pH: 5 (ref 5.0–8.0)

## 2023-10-26 LAB — MAGNESIUM: Magnesium: 1.7 mg/dL (ref 1.7–2.4)

## 2023-10-26 MED ORDER — CYCLOBENZAPRINE HCL 5 MG PO TABS: 5.0000 mg | ORAL_TABLET | Freq: Three times a day (TID) | ORAL | 0 refills | Status: DC | PRN

## 2023-10-26 MED ORDER — MAGNESIUM OXIDE -MG SUPPLEMENT 400 (240 MG) MG PO TABS
400.0000 mg | ORAL_TABLET | Freq: Once | ORAL | Status: AC
  Administered 2023-10-26: 400 mg via ORAL
  Filled 2023-10-26: qty 1

## 2023-10-26 MED ORDER — CYCLOBENZAPRINE HCL 5 MG PO TABS: 10.0000 mg | ORAL_TABLET | Freq: Once | ORAL | Status: DC

## 2023-10-26 MED ORDER — MAGNESIUM OXIDE 400 MG PO TABS: 400.0000 mg | ORAL_TABLET | Freq: Every day | ORAL | 9 refills | Status: AC

## 2023-10-26 MED ORDER — HYDROXYZINE HCL 25 MG PO TABS: 25.0000 mg | ORAL_TABLET | Freq: Once | ORAL | Status: DC

## 2023-10-26 MED ORDER — ACETAMINOPHEN 500 MG PO TABS
1000.0000 mg | ORAL_TABLET | Freq: Once | ORAL | Status: AC
  Administered 2023-10-26: 1000 mg via ORAL
  Filled 2023-10-26: qty 2

## 2023-10-26 NOTE — MAU Note (Signed)
.  Christine Greene is a 38 y.o. at [redacted]w[redacted]d here in MAU reporting: swelling in legs and feet since yesterday as well as an elevated BP reading 162/96- patient states she had some juice and graham crackers and it came down to 132/86. Situationally speaking, the patient has been visiting with her father who is in the hospital in [facility] and the patient has been there to support him. Denies any abdominal pain or vaginal bleeding.   Onset of complaint: 10/25/23 Pain score: denies Vitals:   10/26/23 1744  BP: 128/83  Pulse: (!) 104  Resp: 18  Temp: 98.2 F (36.8 C)  SpO2: 100%     FHT: 146  Lab orders placed from triage: UA

## 2023-10-26 NOTE — MAU Provider Note (Signed)
 None     S [name] is a 38 y.o. [phone] pregnant female at [redacted]w[redacted]d who presents to MAU today with complaint of bilateral lower extremity swelling.   Receives care at [facility]. Prenatal records reviewed.  Pertinent items noted in HPI and remainder of comprehensive ROS otherwise negative.   O BP 132/80   Pulse (!) 104   Temp 98.2 F (36.8 C)   Resp 18   Ht 5' (1.524 m)   Wt 92.7 kg   LMP 07/26/2023 (Exact Date)   SpO2 98%   BMI 39.90 kg/m  Physical Exam Vitals reviewed.  Constitutional:      Appearance: Normal appearance.  HENT:     Head: Normocephalic.  Cardiovascular:     Rate and Rhythm: Normal rate.     Pulses: Normal pulses.  Pulmonary:     Effort: Pulmonary effort is normal.  Musculoskeletal:     Right lower leg: 2+ Edema present.     Left lower leg: 2+ Edema present.  Skin:    General: Skin is warm and dry.     Capillary Refill: Capillary refill takes less than 2 seconds.  Neurological:     Mental Status: She is alert and oriented to person, place, and time.     Deep Tendon Reflexes: Reflexes are normal and symmetric.     Reflex Scores:      Patellar reflexes are 2+ on the right side and 2+ on the left side.    Comments: Clonus negative  Psychiatric:        Mood and Affect: Mood normal.        Behavior: Behavior normal.        Thought Content: Thought content normal.      MDM: Reviewed EMR PE Labs: CBC, CMP, PC ratio Trial of Tylenol  and Magnesium  with good relief  MAU Course:  A Edema, unspecified type  [redacted] weeks gestation of pregnancy  Presence of fetal heart sounds in second trimester  Medical screening exam complete Labs reassuring.   P Discharge from MAU in stable condition with routine precautions Follow up at [facility] as scheduled (10/28/23) for ongoing prenatal care Medications effective for some relief of pain and patient desires to be discharged to home.   Allergies as of 10/26/2023       Reactions    Chlorhexidine  Gluconate Itching        Medication List     STOP taking these medications    escitalopram  20 MG tablet Commonly known as: LEXAPRO    lidocaine  2 % jelly Commonly known as: XYLOCAINE        TAKE these medications    buPROPion 150 MG 24 hr tablet Commonly known as: WELLBUTRIN XL Take 150 mg by mouth daily.   cyclobenzaprine  5 MG tablet Commonly known as: FLEXERIL  Take 1 tablet (5 mg total) by mouth 3 (three) times daily as needed.   DULoxetine 60 MG capsule Commonly known as: CYMBALTA Take 60 mg by mouth daily.   magnesium  oxide 400 MG tablet Commonly known as: MAG-OX Take 1 tablet (400 mg total) by mouth daily.   metoCLOPramide  10 MG tablet Commonly known as: REGLAN  Take 1 tablet (10 mg total) by mouth every 6 (six) hours as needed for nausea or vomiting.   multivitamin-prenatal 27-0.8 MG Tabs tablet Take 1 tablet by mouth daily at 12 noon.   ondansetron  4 MG tablet Commonly known as: ZOFRAN  Take 1 tablet (4 mg total) by mouth every 6 (six)  hours as needed for nausea.   pantoprazole  20 MG tablet Commonly known as: PROTONIX  Take 1 tablet (20 mg total) by mouth daily.   scopolamine  1 MG/3DAYS Commonly known as: TRANSDERM-SCOP Place 1 patch (1.5 mg total) onto the skin every 3 (three) days.   traZODone 100 MG tablet Commonly known as: DESYREL Take 100 mg by mouth at bedtime.        Raford Bunk, MSN, CNM 10/26/2023 8:58 PM  Certified Nurse Midwife, [facility]

## 2023-10-26 NOTE — Discharge Instructions (Signed)
 For swelling: nettle and dandelion tinctures (follow directions on bottles), maintain hydration 8-10 cups water / day minimal   Supplies can be found online at [employer] sources at [employer], [employer], [employer], and supplement section of most pharmacies  -Magnesium  glycinate, 200mg  - 400mg  by mouth, once daily at bedtime

## 2023-11-07 DIAGNOSIS — Z6791 Unspecified blood type, Rh negative: Secondary | ICD-10-CM | POA: Insufficient documentation

## 2023-11-25 ENCOUNTER — Encounter: Payer: Self-pay | Admitting: Pulmonary Disease

## 2023-11-27 DIAGNOSIS — O99210 Obesity complicating pregnancy, unspecified trimester: Secondary | ICD-10-CM | POA: Insufficient documentation

## 2023-11-27 DIAGNOSIS — O09529 Supervision of elderly multigravida, unspecified trimester: Secondary | ICD-10-CM | POA: Insufficient documentation

## 2023-11-27 DIAGNOSIS — Z8759 Personal history of other complications of pregnancy, childbirth and the puerperium: Secondary | ICD-10-CM | POA: Insufficient documentation

## 2023-11-27 DIAGNOSIS — N96 Recurrent pregnancy loss: Secondary | ICD-10-CM | POA: Insufficient documentation

## 2023-12-01 ENCOUNTER — Other Ambulatory Visit: Payer: Self-pay

## 2023-12-01 ENCOUNTER — Observation Stay (HOSPITAL_BASED_OUTPATIENT_CLINIC_OR_DEPARTMENT_OTHER)

## 2023-12-01 ENCOUNTER — Inpatient Hospital Stay (HOSPITAL_COMMUNITY)
Admission: AD | Admit: 2023-12-01 | Discharge: 2023-12-03 | DRG: 832 | Disposition: A | Discharge status: Home / Self Care | Attending: Obstetrics and Gynecology | Admitting: Obstetrics and Gynecology

## 2023-12-01 ENCOUNTER — Encounter (HOSPITAL_COMMUNITY): Payer: Self-pay | Admitting: Student

## 2023-12-01 DIAGNOSIS — O4292 Full-term premature rupture of membranes, unspecified as to length of time between rupture and onset of labor: Secondary | ICD-10-CM

## 2023-12-01 DIAGNOSIS — O99342 Other mental disorders complicating pregnancy, second trimester: Secondary | ICD-10-CM | POA: Diagnosis present

## 2023-12-01 DIAGNOSIS — R102 Pelvic and perineal pain: Secondary | ICD-10-CM

## 2023-12-01 DIAGNOSIS — O09522 Supervision of elderly multigravida, second trimester: Secondary | ICD-10-CM

## 2023-12-01 DIAGNOSIS — O26892 Other specified pregnancy related conditions, second trimester: Secondary | ICD-10-CM

## 2023-12-01 DIAGNOSIS — Z3A19 19 weeks gestation of pregnancy: Secondary | ICD-10-CM | POA: Diagnosis not present

## 2023-12-01 DIAGNOSIS — O4103X0 Oligohydramnios, third trimester, not applicable or unspecified: Secondary | ICD-10-CM | POA: Diagnosis present

## 2023-12-01 DIAGNOSIS — O42912 Preterm premature rupture of membranes, unspecified as to length of time between rupture and onset of labor, second trimester: Secondary | ICD-10-CM | POA: Diagnosis not present

## 2023-12-01 DIAGNOSIS — F419 Anxiety disorder, unspecified: Secondary | ICD-10-CM | POA: Diagnosis present

## 2023-12-01 DIAGNOSIS — O26893 Other specified pregnancy related conditions, third trimester: Secondary | ICD-10-CM | POA: Diagnosis not present

## 2023-12-01 DIAGNOSIS — Z833 Family history of diabetes mellitus: Secondary | ICD-10-CM

## 2023-12-01 DIAGNOSIS — O42919 Preterm premature rupture of membranes, unspecified as to length of time between rupture and onset of labor, unspecified trimester: Principal | ICD-10-CM | POA: Diagnosis present

## 2023-12-01 LAB — CBC WITH DIFFERENTIAL/PLATELET
Abs Immature Granulocytes: 0.07 10*3/uL (ref 0.00–0.07)
Basophils Absolute: 0 10*3/uL (ref 0.0–0.1)
Basophils Relative: 0 %
Eosinophils Absolute: 0.1 10*3/uL (ref 0.0–0.5)
Eosinophils Relative: 1 %
HCT: 35.1 % — ABNORMAL LOW (ref 36.0–46.0)
Hemoglobin: 12.1 g/dL (ref 12.0–15.0)
Immature Granulocytes: 1 %
Lymphocytes Relative: 18 %
Lymphs Abs: 1.9 10*3/uL (ref 0.7–4.0)
MCH: 30.6 pg (ref 26.0–34.0)
MCHC: 34.5 g/dL (ref 30.0–36.0)
MCV: 88.9 fL (ref 80.0–100.0)
Monocytes Absolute: 0.7 10*3/uL (ref 0.1–1.0)
Monocytes Relative: 7 %
Neutro Abs: 7.8 10*3/uL — ABNORMAL HIGH (ref 1.7–7.7)
Neutrophils Relative %: 73 %
Platelets: 187 10*3/uL (ref 150–400)
RBC: 3.95 MIL/uL (ref 3.87–5.11)
RDW: 13.9 % (ref 11.5–15.5)
WBC: 10.6 10*3/uL — ABNORMAL HIGH (ref 4.0–10.5)
nRBC: 0 % (ref 0.0–0.2)

## 2023-12-01 LAB — TYPE AND SCREEN
ABO/RH(D): B NEG
Antibody Screen: NEGATIVE

## 2023-12-01 LAB — POCT FERN TEST
POCT Fern Test: NEGATIVE
POCT Fern Test: POSITIVE

## 2023-12-01 LAB — URINALYSIS, ROUTINE W REFLEX MICROSCOPIC
Bilirubin Urine: NEGATIVE
Glucose, UA: NEGATIVE mg/dL
Hgb urine dipstick: NEGATIVE
Ketones, ur: 20 mg/dL — AB
Leukocytes,Ua: NEGATIVE
Nitrite: NEGATIVE
Protein, ur: NEGATIVE mg/dL
Specific Gravity, Urine: 1.025 (ref 1.005–1.030)
pH: 5 (ref 5.0–8.0)

## 2023-12-01 MED ORDER — PRENATAL MULTIVITAMIN CH
1.0000 | ORAL_TABLET | Freq: Every day | ORAL | Status: DC
  Administered 2023-12-01 – 2023-12-03 (×3): 1 via ORAL
  Filled 2023-12-01 (×3): qty 1

## 2023-12-01 MED ORDER — ONDANSETRON HCL 4 MG/2ML IJ SOLN
4.0000 mg | Freq: Four times a day (QID) | INTRAMUSCULAR | Status: DC | PRN
  Administered 2023-12-01 – 2023-12-03 (×6): 4 mg via INTRAVENOUS
  Filled 2023-12-01 (×7): qty 2

## 2023-12-01 MED ORDER — ZOLPIDEM TARTRATE 5 MG PO TABS
5.0000 mg | ORAL_TABLET | Freq: Once | ORAL | Status: AC
  Administered 2023-12-01: 5 mg via ORAL
  Filled 2023-12-01: qty 1

## 2023-12-01 MED ORDER — SODIUM CHLORIDE 0.9% FLUSH
3.0000 mL | INTRAVENOUS | Status: DC | PRN
  Administered 2023-12-01: 10 mL via INTRAVENOUS

## 2023-12-01 MED ORDER — TRAZODONE HCL 100 MG PO TABS
100.0000 mg | ORAL_TABLET | Freq: Every day | ORAL | Status: DC
  Administered 2023-12-01 – 2023-12-02 (×2): 100 mg via ORAL
  Filled 2023-12-01 (×3): qty 1

## 2023-12-01 MED ORDER — CALCIUM CARBONATE ANTACID 500 MG PO CHEW
2.0000 | CHEWABLE_TABLET | ORAL | Status: DC | PRN
Start: 2023-12-01 — End: 2023-12-03

## 2023-12-01 MED ORDER — DULOXETINE HCL 20 MG PO CPEP
60.0000 mg | ORAL_CAPSULE | Freq: Every day | ORAL | Status: DC
  Administered 2023-12-02 – 2023-12-03 (×2): 60 mg via ORAL
  Filled 2023-12-01 (×2): qty 3

## 2023-12-01 MED ORDER — ACETAMINOPHEN 325 MG PO TABS
650.0000 mg | ORAL_TABLET | ORAL | Status: DC | PRN
  Administered 2023-12-01 – 2023-12-02 (×2): 650 mg via ORAL
  Filled 2023-12-01 (×2): qty 2

## 2023-12-01 MED ORDER — SODIUM CHLORIDE 0.9% FLUSH
3.0000 mL | Freq: Two times a day (BID) | INTRAVENOUS | Status: DC
  Administered 2023-12-01: 10 mL via INTRAVENOUS
  Administered 2023-12-02: 3 mL via INTRAVENOUS
  Administered 2023-12-02 – 2023-12-03 (×2): 10 mL via INTRAVENOUS

## 2023-12-01 MED ORDER — SODIUM CHLORIDE 0.9 % IV SOLN
INTRAVENOUS | Status: AC | PRN
  Administered 2023-12-01: 10 mL/h via INTRAVENOUS

## 2023-12-01 MED ORDER — SODIUM CHLORIDE 0.9 % IV SOLN
2.0000 g | Freq: Four times a day (QID) | INTRAVENOUS | Status: AC
  Administered 2023-12-01 – 2023-12-03 (×8): 2 g via INTRAVENOUS
  Filled 2023-12-01 (×8): qty 2000

## 2023-12-01 MED ORDER — LACTATED RINGERS IV BOLUS
500.0000 mL | Freq: Once | INTRAVENOUS | Status: AC
  Administered 2023-12-01: 500 mL via INTRAVENOUS

## 2023-12-01 MED ORDER — AMOXICILLIN 500 MG PO CAPS: 500.0000 mg | ORAL_CAPSULE | Freq: Three times a day (TID) | ORAL | Status: DC

## 2023-12-01 MED ORDER — AZITHROMYCIN 250 MG PO TABS
1000.0000 mg | ORAL_TABLET | Freq: Once | ORAL | Status: AC
  Administered 2023-12-01: 1000 mg via ORAL
  Filled 2023-12-01: qty 4

## 2023-12-01 MED ORDER — PANTOPRAZOLE SODIUM 20 MG PO TBEC
20.0000 mg | DELAYED_RELEASE_TABLET | Freq: Every day | ORAL | Status: DC
  Administered 2023-12-02 – 2023-12-03 (×2): 20 mg via ORAL
  Filled 2023-12-01 (×2): qty 1

## 2023-12-01 MED ORDER — BUPROPION HCL ER (XL) 150 MG PO TB24
150.0000 mg | ORAL_TABLET | Freq: Every day | ORAL | Status: DC
  Administered 2023-12-01 – 2023-12-02 (×2): 150 mg via ORAL
  Filled 2023-12-01 (×4): qty 1

## 2023-12-01 MED ORDER — DOXYLAMINE SUCCINATE (SLEEP) 25 MG PO TABS: 25.0000 mg | ORAL_TABLET | Freq: Every evening | ORAL | Status: DC | PRN

## 2023-12-01 MED ORDER — HYDROXYZINE HCL 10 MG PO TABS
10.0000 mg | ORAL_TABLET | Freq: Every evening | ORAL | Status: DC | PRN
  Administered 2023-12-01 – 2023-12-02 (×3): 10 mg via ORAL
  Filled 2023-12-01 (×3): qty 1

## 2023-12-01 NOTE — MAU Note (Signed)
 Christine Greene is a 38 y.o. at [redacted]w[redacted]d here in MAU reporting: woke up and couldn't control bladder - went to bathroom and peed, but when she stood up "it wouldn't stop" - unsure if water broke or if she is peeing on herself. Reports pink spotting. Mild period-like cramping in lower abdomen. States she is having burning during urination as well.   LMP: NA  Onset of complaint: 0140 Pain score: 2 Vitals:   12/01/23 0326  BP: 133/84  Pulse: (!) 108  Resp: 17  Temp: 98 F (36.7 C)  SpO2: 100%     FHT: 150  Lab orders placed from triage: UA

## 2023-12-01 NOTE — Progress Notes (Signed)
 Progress Note  Called by RN - Patient feeling pelvic pressure and cramping.  IV fluid bolus started. I assessed patient at bedside.  States she feels intermittent cramping and like she needs to have a bowel movement.  Toco quiet - uterus palpates soft while she is feeling cramping.  SVE: 0/thick/high  Patient reassured.  Discussed plan of care after MFM consult/US  this afternoon. She wants to do everything possible to have a successful pregnancy outcome. Per discussion with MFM, started latency antibiotics today, will plan to complete IV antibiotics x 48 hours and then discharge home with PO x 5 days. If undelivered and viable, plan to readmit at 23 weeks for betamethasone  course and inpatient observation. She stated understanding of risks of continuing pregnancy and declined induction of labor.   Plan Vistaril  at bedtime PRN anxiety.   Veleria Germany MD 12/01/23 9:12 PM

## 2023-12-01 NOTE — Consult Note (Signed)
 MFM Consult Note  Christine Greene is a 38 year old gravida 12 para 4 currently at 19 weeks and 2 days.  She was seen in consultation today due to previable PPROM.  The patient reports that she started to leak fluid from the vagina at around 1:30 AM this morning.  PPROM was confirmed in the MAU with a positive ferning test.  The patient reports that a subchorionic hematoma was noted earlier in her pregnancy.  She denies any recent vaginal bleeding.  She had a cell free DNA test drawn earlier in her pregnancy which indicated a low risk for trisomy 27, 12, and 13.  A female fetus is predicted.  Her cervix was closed on admission.  She does not show any signs or symptoms of an intrauterine infection.  Currently, the patient reports that she is feeling well.  The lower abdominal cramping that she felt earlier this morning has resolved.  An ultrasound performed this morning showed a EFW of 293 g (10 ounces, 55th percentile).    Oligohydramnios with a total AFI of 3 cm was noted.  The fetus is in the vertex presentation.  The views of the fetal anatomy were limited today due to the low amniotic fluid levels.  The usual management of previable PPROM was discussed with the patient and her husband.    They understand that delivery prior to viability, which is currently considered ataround 23 weeks, will result in a baby that will not be able to survive.  The increased risk of an intrauterine infection and preterm labor/preterm delivery due to PPROM was discussed.    The patient and her husband would like everything possible to be done to help them achieve a successful pregnancy outcome.    Due to the potential for maternal infection, sepsis, and death they were offered and declined an induction of labor today.  The patient and her husband requested that they receive latency antibiotics in an effort to prolong her pregnancy so that she can reach viability. As the administration of latency antibiotics  will do no harm and possibly prevent an intrauterine infection, it may be reasonable for her to receive a 7-day course of latency antibiotics.  The patient and her husband understand that despite receiving latency antibiotics, she may still develop an intrauterine infection, have a preterm/previable delivery, or have an IUFD due to cord compression related to anhydramnios.  In rare cases, in previable PPROM, the membranes may seal and the amniotic fluid level may normalize.  They understand that we will continue observation in the hospital for the next 2 days so that she can receive the IV portion of latency antibiotics.  She can be discharged home to complete the oral course of the latency antibiotics.  Should she remain undelivered with a viable fetus, she should be readmitted to the hospital for continued inpatient management until delivery starting at around 23 weeks.  We will administer a complete course of antenatal corticosteroids at that time.    They understand that due to PPROM, delivery is usually recommended at around 34 weeks.  Once the patient is discharged, I will make arrangements for her to come to our office for an ultrasound exam at the end of next week for fetal assessment.    I will also perform an ultrasound prior to her discharge in 2 days for amniotic fluid assessment.  The patient and her husband are comfortable with this plan.    They stated that all of their questions were answered today.

## 2023-12-01 NOTE — H&P (Signed)
 Christine Greene is a 38 y.o. female presenting for leakage of fluid since 0130.  PPROM confirmed in MAU via +fern. Menstrual-like cramping started after leakage of fluid.   Pregnancy is complicated by:  - Advanced maternal age: low risk XX NIPT - Recent grief: loss of father 2 weeks ago - History of RPL - Poor obstetric history (GHTN, PTL) OB History     Gravida  12   Para  3   Term  2   Preterm  1   AB  8   Living  4      SAB  7   IAB  0   Ectopic  1   Multiple  1   Live Births  4          Past Medical History:  Diagnosis Date   Anxiety    Depression    Goiter    history of Anemia    PIH (pregnancy induced hypertension) 09/05/2016   PONV (postoperative nausea and vomiting)    Post-operative state 12/18/2022   Postoperative state 12/16/2022   Preterm labor    Retained products of conception after miscarriage 12/17/2022   Spontaneous vaginal delivery 09/05/2016   Threatened preterm labor, third trimester 08/14/2016   Past Surgical History:  Procedure Laterality Date   APPENDECTOMY  2008   DILATION AND CURETTAGE OF UTERUS     DILATION AND EVACUATION N/A 12/16/2022   Procedure: DILATATION AND EVACUATION;  Surgeon: Matt Song, MD;  Location: [facility];  Service: Gynecology;  Laterality: N/A;   DILATION AND EVACUATION N/A 12/18/2022   Procedure: DILATATION AND EVACUATION;  Surgeon: Matt Song, MD;  Location: [facility];  Service: Gynecology;  Laterality: N/A;   ECTOPIC PREGNANCY SURGERY  2007   LAPAROSCOPY FOR ECTOPIC PREGNANCY     VAGINAL DELIVERY  11/05/2011   Procedure: VAGINAL DELIVERY;  Surgeon: Hamp Levine, MD;  Location: [facility];  Service: Gynecology;  Laterality: N/A;   Family History: family history includes Diabetes in her father; Healthy in her mother. Social History:  reports that she has never smoked. She has never used smokeless tobacco. She reports that she does not drink alcohol and does not use drugs.      Review of Systems  Constitutional:  Negative for chills and fever.  Gastrointestinal:  Positive for abdominal pain.  Genitourinary:  Positive for pelvic pain and vaginal discharge. Negative for vaginal bleeding.   History   Blood pressure 133/84, pulse (!) 108, temperature 98 F (36.7 C), temperature source Oral, resp. rate 17, height 5' (1.524 m), weight 92.7 kg, last menstrual period 07/19/2023, SpO2 100%. Exam Physical Exam Exam conducted with a chaperone present.  Constitutional:      Appearance: She is obese.  HENT:     Head: Normocephalic.  Cardiovascular:     Rate and Rhythm: Tachycardia present.  Abdominal:     Palpations: Abdomen is soft.     Tenderness: There is no abdominal tenderness.  Genitourinary:    Rectum: External hemorrhoid present.     Comments: SSE: large pooling of clear fluid, os closed Skin:    General: Skin is warm and dry.  Neurological:     Mental Status: She is alert.  Psychiatric:        Mood and Affect: Affect is tearful.     Prenatal labs: ABO, Rh: --/--/B NEG (06/19 0045) Antibody: POS (06/19 0045) Rubella:  Immune RPR:  NR HBsAg:  Negative  HIV:   Negative GBS:  Assessment/Plan: 38 yo [id number] @ 19.2 with PPPROM - PPPROM: Cervix is closed, no signs of chorioamnionitis. Pain concerning for developing PTL. - We had an honest discussion with her and her husband regarding outcome. This is a highly desired pregnancy with baby "Alora". We discussed that pregnancies with ruptured membranes at such an early gestational age are at risk of pulmonary hypoplasia, intra-amnionic infection, labor, and fetal demise. She inquired about resealing of membranes, which I discussed is theoretically possible but of very low probability. She also inquired about tocolytics but I counseled that their benefit is to administer BMZ in the case of periviability, unfortunately for which she is many weeks away from. She is tearful but very understanding.  - Next  steps including US  to evaluate for anhydramnios vs oligohydramnios and fetal position. Will also consult MFM   Leanne Pronto 12/01/2023, 5:20 AM

## 2023-12-01 NOTE — MAU Provider Note (Signed)
 History     [id number]  Arrival date and time: 12/01/23 0257    Chief Complaint  Patient presents with   Abdominal Pain   Vaginal Discharge     HPI Christine Greene is a 38 y.o. at [redacted]w[redacted]d by 8 wk US  with PMHx notable for Rh negative, AMA, gHTN, hyperemesis, preterm delivery of twins at 26 weeks, who presents for leaking fluid.   Review of outside prenatal records from [facility] (in media tab): unremarkable new OB labs  Patient reports she got up to go to the bathroom to urinate, and when she got up it was still coming out, occurred around 0130 Has had some burning or pain with urination that started yesterday and has gotten worse Also when wiping having some pink tinged Has started having some pelvic cramping as well Very nervous about these symptoms Last intercourse was "a while ago"    --/--/B NEG (06/19 0045)  OB History     Gravida  12   Para  3   Term  2   Preterm  1   AB  8   Living  4      SAB  7   IAB  0   Ectopic  1   Multiple  1   Live Births  4           Past Medical History:  Diagnosis Date   Anxiety    Depression    Goiter    history of Anemia    PIH (pregnancy induced hypertension) 09/05/2016   PONV (postoperative nausea and vomiting)    Post-operative state 12/18/2022   Postoperative state 12/16/2022   Preterm labor    Retained products of conception after miscarriage 12/17/2022   Spontaneous vaginal delivery 09/05/2016   Threatened preterm labor, third trimester 08/14/2016    Past Surgical History:  Procedure Laterality Date   APPENDECTOMY  2008   DILATION AND CURETTAGE OF UTERUS     DILATION AND EVACUATION N/A 12/16/2022   Procedure: DILATATION AND EVACUATION;  Surgeon: Matt Song, MD;  Location: [facility];  Service: Gynecology;  Laterality: N/A;   DILATION AND EVACUATION N/A 12/18/2022   Procedure: DILATATION AND EVACUATION;  Surgeon: Matt Song, MD;  Location: [facility];   Service: Gynecology;  Laterality: N/A;   ECTOPIC PREGNANCY SURGERY  2007   LAPAROSCOPY FOR ECTOPIC PREGNANCY     VAGINAL DELIVERY  11/05/2011   Procedure: VAGINAL DELIVERY;  Surgeon: Hamp Levine, MD;  Location: [facility];  Service: Gynecology;  Laterality: N/A;    Family History  Problem Relation Age of Onset   Healthy Mother    Diabetes Father     Social History   Socioeconomic History   Marital status: Married    Spouse name: Not on file   Number of children: Not on file   Years of education: Not on file   Highest education level: Not on file  Occupational History   Not on file  Tobacco Use   Smoking status: Never   Smokeless tobacco: Never  Vaping Use   Vaping status: Never Used  Substance and Sexual Activity   Alcohol use: No   Drug use: No   Sexual activity: Not Currently    Birth control/protection: None  Other Topics Concern   Not on file  Social History Narrative   Not on file   Social Drivers of Health   Financial Resource Strain: Not on file  Food Insecurity: No Food Insecurity (  12/18/2022)   Hunger Vital Sign    Worried About Running Out of Food in the Last Year: Never true    Ran Out of Food in the Last Year: Never true  Transportation Needs: No Transportation Needs (12/18/2022)   PRAPARE - Administrator, Civil Service (Medical): No    Lack of Transportation (Non-Medical): No  Physical Activity: Not on file  Stress: Not on file  Social Connections: Not on file  Intimate Partner Violence: Not At Risk (12/18/2022)   Humiliation, Afraid, Rape, and Kick questionnaire    Fear of Current or Ex-Partner: No    Emotionally Abused: No    Physically Abused: No    Sexually Abused: No    Allergies  Allergen Reactions   Chlorhexidine  Gluconate Itching    No current facility-administered medications on file prior to encounter.   Current Outpatient Medications on File Prior to Encounter  Medication Sig Dispense Refill   buPROPion (WELLBUTRIN  XL) 150 MG 24 hr tablet Take 150 mg by mouth daily.     DULoxetine (CYMBALTA) 60 MG capsule Take 60 mg by mouth daily.     pantoprazole  (PROTONIX ) 20 MG tablet Take 1 tablet (20 mg total) by mouth daily. 30 tablet 2   Prenatal Vit-Fe Fumarate-FA (MULTIVITAMIN-PRENATAL) 27-0.8 MG TABS tablet Take 1 tablet by mouth daily at 12 noon.     traZODone (DESYREL) 100 MG tablet Take 100 mg by mouth at bedtime.     cyclobenzaprine  (FLEXERIL ) 5 MG tablet Take 1 tablet (5 mg total) by mouth 3 (three) times daily as needed. 20 tablet 0   magnesium  oxide (MAG-OX) 400 MG tablet Take 1 tablet (400 mg total) by mouth daily. 30 tablet 9   metoCLOPramide  (REGLAN ) 10 MG tablet Take 1 tablet (10 mg total) by mouth every 6 (six) hours as needed for nausea or vomiting. 20 tablet 0   ondansetron  (ZOFRAN ) 4 MG tablet Take 1 tablet (4 mg total) by mouth every 6 (six) hours as needed for nausea. 20 tablet 0   scopolamine  (TRANSDERM-SCOP) 1 MG/3DAYS Place 1 patch (1.5 mg total) onto the skin every 3 (three) days. 10 patch 0     ROS Pertinent positives and negative per HPI, all others reviewed and negative  Physical Exam   BP 133/84 (BP Location: Right Arm)   Pulse (!) 108   Temp 98 F (36.7 C) (Oral)   Resp 17   Ht 5' (1.524 m)   Wt 92.7 kg   LMP 07/19/2023   SpO2 100%   BMI 39.90 kg/m   Patient Vitals for the past 24 hrs:  BP Temp Temp src Pulse Resp SpO2 Height Weight  12/01/23 0326 133/84 98 F (36.7 C) Oral (!) 108 17 100 % 5' (1.524 m) 92.7 kg    Physical Exam Vitals reviewed.  Constitutional:      General: She is not in acute distress.    Appearance: She is well-developed. She is not diaphoretic.  Eyes:     General: No scleral icterus. Pulmonary:     Effort: Pulmonary effort is normal. No respiratory distress.  Abdominal:     General: There is no distension.     Palpations: Abdomen is soft.     Tenderness: There is no abdominal tenderness. There is no guarding or rebound.  Genitourinary:     Comments: Leaking fluid seen coming out of introitus. Sample of fluid fern positive Skin:    General: Skin is warm and dry.  Neurological:  Mental Status: She is alert.     Coordination: Coordination normal.      Cervical Exam    Bedside Ultrasound Pt informed that the ultrasound is considered a limited OB ultrasound and is not intended to be a complete ultrasound exam.  Patient also informed that the ultrasound is not being completed with the intent of assessing for fetal or placental anomalies or any pelvic abnormalities.  Explained that the purpose of today's ultrasound is to assess for  AFI.  Patient acknowledges the purpose of the exam and the limitations of the study.      My interpretation: fetus in variable presentation, mostly breech and transverse. Subjectively normal fetal cardiac activity. Subjectively reduced AFI, MVP of 3.6 cm but no other pockets found  FHT 150 bpm by doppler  Labs Results for orders placed or performed during the hospital encounter of 12/01/23 (from the past 24 hours)  Urinalysis, Routine w reflex microscopic -Urine, Clean Catch     Status: Abnormal   Collection Time: 12/01/23  3:48 AM  Result Value Ref Range   Color, Urine YELLOW YELLOW   APPearance HAZY (A) CLEAR   Specific Gravity, Urine 1.025 1.005 - 1.030   pH 5.0 5.0 - 8.0   Glucose, UA NEGATIVE NEGATIVE mg/dL   Hgb urine dipstick NEGATIVE NEGATIVE   Bilirubin Urine NEGATIVE NEGATIVE   Ketones, ur 20 (A) NEGATIVE mg/dL   Protein, ur NEGATIVE NEGATIVE mg/dL   Nitrite NEGATIVE NEGATIVE   Leukocytes,Ua NEGATIVE NEGATIVE  Fern Test     Status: None   Collection Time: 12/01/23  3:51 AM  Result Value Ref Range   POCT Fern Test Negative = intact amniotic membranes   Fern Test     Status: None   Collection Time: 12/01/23  4:22 AM  Result Value Ref Range   POCT Fern Test Positive = ruptured amniotic membanes     Imaging No results found.  MAU Course  Procedures Lab Orders          Urinalysis, Routine w reflex microscopic -Urine, Clean Catch         [facility] Test     No orders of the defined types were placed in this encounter.  Imaging Orders  No imaging studies ordered today    MDM Moderate (Level 3-4)  Assessment and Plan  #Preterm premature rupture of membranes #[redacted] weeks gestation of pregnancy Fern positive and bedside US  with findings concerning for oligo. Does not appear to be laborous at the moment though she is cramping. Discussed with patient, she would like to do everything possible to achieve viability and survival. Discussed with on call Dr. Felipe Horton, she will admit patient for observation and consultation with MFM/Neonatology.    Dispo: admit to antepartum unit, care turned over to private physician    Teena Feast, MD/MPH 12/01/23 4:41 AM

## 2023-12-02 ENCOUNTER — Ambulatory Visit

## 2023-12-02 ENCOUNTER — Inpatient Hospital Stay (HOSPITAL_COMMUNITY)

## 2023-12-02 ENCOUNTER — Other Ambulatory Visit

## 2023-12-02 DIAGNOSIS — R102 Pelvic and perineal pain: Secondary | ICD-10-CM | POA: Diagnosis not present

## 2023-12-02 DIAGNOSIS — Z3A19 19 weeks gestation of pregnancy: Secondary | ICD-10-CM | POA: Diagnosis not present

## 2023-12-02 DIAGNOSIS — O4103X0 Oligohydramnios, third trimester, not applicable or unspecified: Secondary | ICD-10-CM | POA: Diagnosis present

## 2023-12-02 DIAGNOSIS — O09522 Supervision of elderly multigravida, second trimester: Secondary | ICD-10-CM

## 2023-12-02 DIAGNOSIS — O42912 Preterm premature rupture of membranes, unspecified as to length of time between rupture and onset of labor, second trimester: Secondary | ICD-10-CM | POA: Diagnosis present

## 2023-12-02 DIAGNOSIS — F419 Anxiety disorder, unspecified: Secondary | ICD-10-CM | POA: Diagnosis present

## 2023-12-02 DIAGNOSIS — Z833 Family history of diabetes mellitus: Secondary | ICD-10-CM | POA: Diagnosis not present

## 2023-12-02 DIAGNOSIS — O26893 Other specified pregnancy related conditions, third trimester: Secondary | ICD-10-CM | POA: Diagnosis present

## 2023-12-02 DIAGNOSIS — O26892 Other specified pregnancy related conditions, second trimester: Secondary | ICD-10-CM | POA: Diagnosis not present

## 2023-12-02 DIAGNOSIS — O4292 Full-term premature rupture of membranes, unspecified as to length of time between rupture and onset of labor: Secondary | ICD-10-CM | POA: Diagnosis not present

## 2023-12-02 DIAGNOSIS — O99212 Obesity complicating pregnancy, second trimester: Secondary | ICD-10-CM

## 2023-12-02 DIAGNOSIS — O99342 Other mental disorders complicating pregnancy, second trimester: Secondary | ICD-10-CM | POA: Diagnosis present

## 2023-12-02 DIAGNOSIS — N96 Recurrent pregnancy loss: Secondary | ICD-10-CM

## 2023-12-02 DIAGNOSIS — Z8759 Personal history of other complications of pregnancy, childbirth and the puerperium: Secondary | ICD-10-CM

## 2023-12-02 LAB — COMPREHENSIVE METABOLIC PANEL WITH GFR
ALT: 16 U/L (ref 0–44)
AST: 20 U/L (ref 15–41)
Albumin: 2.9 g/dL — ABNORMAL LOW (ref 3.5–5.0)
Alkaline Phosphatase: 50 U/L (ref 38–126)
Anion gap: 11 (ref 5–15)
BUN: <5 mg/dL — ABNORMAL LOW (ref 6–20)
CO2: 22 mmol/L (ref 22–32)
Calcium: 8.8 mg/dL — ABNORMAL LOW (ref 8.9–10.3)
Chloride: 102 mmol/L (ref 98–111)
Creatinine, Ser: 0.51 mg/dL (ref 0.44–1.00)
GFR, Estimated: >60 mL/min (ref >60)
Glucose, Bld: 97 mg/dL (ref 70–99)
Potassium: 3.6 mmol/L (ref 3.5–5.1)
Sodium: 135 mmol/L (ref 135–145)
Total Bilirubin: 0.5 mg/dL (ref 0.0–1.2)
Total Protein: 5.9 g/dL — ABNORMAL LOW (ref 6.5–8.1)

## 2023-12-02 LAB — CBC
HCT: 32.5 % — ABNORMAL LOW (ref 36.0–46.0)
Hemoglobin: 11.2 g/dL — ABNORMAL LOW (ref 12.0–15.0)
MCH: 30.5 pg (ref 26.0–34.0)
MCHC: 34.5 g/dL (ref 30.0–36.0)
MCV: 88.6 fL (ref 80.0–100.0)
Platelets: 161 10*3/uL (ref 150–400)
RBC: 3.67 MIL/uL — ABNORMAL LOW (ref 3.87–5.11)
RDW: 14 % (ref 11.5–15.5)
WBC: 8.3 10*3/uL (ref 4.0–10.5)
nRBC: 0 % (ref 0.0–0.2)

## 2023-12-02 MED ORDER — BUTALBITAL-APAP-CAFFEINE 50-325-40 MG PO TABS
1.0000 | ORAL_TABLET | Freq: Four times a day (QID) | ORAL | Status: DC | PRN
  Administered 2023-12-02: 1 via ORAL
  Filled 2023-12-02: qty 1

## 2023-12-02 MED ORDER — CYCLOBENZAPRINE HCL 10 MG PO TABS
5.0000 mg | ORAL_TABLET | Freq: Once | ORAL | Status: AC
  Administered 2023-12-02: 5 mg via ORAL
  Filled 2023-12-02: qty 1

## 2023-12-02 MED ORDER — DIPHENHYDRAMINE HCL 50 MG/ML IJ SOLN
50.0000 mg | Freq: Once | INTRAMUSCULAR | Status: AC
  Administered 2023-12-02: 50 mg via INTRAVENOUS
  Filled 2023-12-02: qty 1

## 2023-12-02 MED ORDER — BUTORPHANOL TARTRATE 2 MG/ML IJ SOLN
1.0000 mg | INTRAMUSCULAR | Status: DC | PRN
  Administered 2023-12-02 – 2023-12-03 (×2): 1 mg via INTRAVENOUS
  Filled 2023-12-02: qty 1
  Filled 2023-12-02 (×2): qty 0.5

## 2023-12-02 MED ORDER — HYDROXYZINE HCL 10 MG PO TABS
10.0000 mg | ORAL_TABLET | Freq: Three times a day (TID) | ORAL | Status: DC | PRN
  Administered 2023-12-03: 10 mg via ORAL
  Filled 2023-12-02: qty 1

## 2023-12-02 NOTE — Progress Notes (Addendum)
 PTBS for evaluation 2/2 new headache. Called slightly before lunch with mild 5/10 HA, frontal. 650mg  Tylenol  given then. Just now, called saying patient "just feels bad"  On exam, patient squinting, appearing light sensitive but conversing without issue. States pain is 8/10, feels like "squeezing vice around head, intense pressure." Has h/o migraines previously but has not had any in pregnancy, notes that these are usually frontal. Has not had fioricet trial. BP 115/62 (BP Location: Right Arm)   Pulse 99   Temp 98.6 F (37 C) (Axillary)   Resp 20   Ht 5' (1.524 m)   Wt 92.7 kg   LMP 07/19/2023   SpO2 99%   BMI 39.90 kg/m  Gen: non-diaphoretic, appears tired CV: CTAB, RRR Abd: nontender fundus at umbilicus. BSUS confirms viable SIUP in vertex position, appears more curled Neuro: FROM in cervical spine, no pain elicited. EOMMI. PERRLA GU deferred  DDX includes tension vs atypical migraine vs acute process. Fioricet trial, reassess in 30-65m, plan for CT non-con if no improvement  1650 - minimal improvement from 8/10 to 7/10, will order CT non-con

## 2023-12-02 NOTE — Progress Notes (Signed)
 AP PROGRESS NOTE S: Denies VV, consistent cramping. Trace LOF, still intermittent pressure. Denies fevers, chills this AM however noted last night one episode of waking up sweaty  O: BP 120/68 (BP Location: Right Arm)   Pulse 98   Temp 98 F (36.7 C) (Oral)   Resp 20   Ht 5' (1.524 m)   Wt 92.7 kg   LMP 07/19/2023   SpO2 100%   BMI 39.90 kg/m  Gen: anxious but otherwise NAD CV: tachy in 100s but neg MRG Abd: Gravid fundus, NTTP, appropriate S=D GU deferred Psych/Neuro: anxious  BSUS: SIUP, +FHT and FM, vertex, unable to measure MVP.  Labs: Fern pos, B neg, 12.1/35.1/187K, WBC 10.6  A/P: This is a 38yo [id number] @ 19 3/7 admitted with PPPROM, HD#2.  -S/p MF consult on HD#1, strongly desired pregnancy. Has decided to complete latency antibiotics and be discharged home on PO options. Strong infectious precautions given today     *If Dc'ed home, plan for weekly MFM fluid/FHT checks, plan for readmission at 23wks with BMTZ until delivery at 34wks -Anxiety: PRN at bedtime vistaril , continue home cymbalta and Wellbutrin -FHT q-shift

## 2023-12-03 ENCOUNTER — Inpatient Hospital Stay (HOSPITAL_BASED_OUTPATIENT_CLINIC_OR_DEPARTMENT_OTHER)

## 2023-12-03 DIAGNOSIS — O09522 Supervision of elderly multigravida, second trimester: Secondary | ICD-10-CM | POA: Diagnosis not present

## 2023-12-03 DIAGNOSIS — O26892 Other specified pregnancy related conditions, second trimester: Secondary | ICD-10-CM

## 2023-12-03 DIAGNOSIS — Z3A19 19 weeks gestation of pregnancy: Secondary | ICD-10-CM

## 2023-12-03 DIAGNOSIS — R102 Pelvic and perineal pain: Secondary | ICD-10-CM | POA: Diagnosis not present

## 2023-12-03 DIAGNOSIS — O4292 Full-term premature rupture of membranes, unspecified as to length of time between rupture and onset of labor: Secondary | ICD-10-CM | POA: Diagnosis not present

## 2023-12-03 LAB — CBC WITH DIFFERENTIAL/PLATELET
Abs Immature Granulocytes: 0.06 10*3/uL (ref 0.00–0.07)
Basophils Absolute: 0 10*3/uL (ref 0.0–0.1)
Basophils Relative: 0 %
Eosinophils Absolute: 0.1 10*3/uL (ref 0.0–0.5)
Eosinophils Relative: 1 %
HCT: 34.3 % — ABNORMAL LOW (ref 36.0–46.0)
Hemoglobin: 11.5 g/dL — ABNORMAL LOW (ref 12.0–15.0)
Immature Granulocytes: 1 %
Lymphocytes Relative: 25 %
Lymphs Abs: 2.1 10*3/uL (ref 0.7–4.0)
MCH: 30.3 pg (ref 26.0–34.0)
MCHC: 33.5 g/dL (ref 30.0–36.0)
MCV: 90.3 fL (ref 80.0–100.0)
Monocytes Absolute: 0.7 10*3/uL (ref 0.1–1.0)
Monocytes Relative: 9 %
Neutro Abs: 5.4 10*3/uL (ref 1.7–7.7)
Neutrophils Relative %: 64 %
Platelets: 177 10*3/uL (ref 150–400)
RBC: 3.8 MIL/uL — ABNORMAL LOW (ref 3.87–5.11)
RDW: 14.2 % (ref 11.5–15.5)
WBC: 8.4 10*3/uL (ref 4.0–10.5)
nRBC: 0 % (ref 0.0–0.2)

## 2023-12-03 MED ORDER — AMOXICILLIN 250 MG PO CAPS: 250.0000 mg | ORAL_CAPSULE | Freq: Three times a day (TID) | ORAL | 0 refills | Status: AC

## 2023-12-03 MED ORDER — AZITHROMYCIN 500 MG PO TABS: 1000.0000 mg | ORAL_TABLET | Freq: Every day | ORAL | 0 refills | Status: AC

## 2023-12-03 MED ORDER — BUTORPHANOL TARTRATE 1 MG/ML IJ SOLN
1.0000 mg | INTRAMUSCULAR | Status: DC | PRN
  Administered 2023-12-03: 1 mg via INTRAVENOUS
  Filled 2023-12-03: qty 1

## 2023-12-03 NOTE — Consult Note (Signed)
 [facility]  Prenatal Consult      12/03/2023   4:29 PM  I was asked by Dr. Sandra Greene to consult on this patient for possible previable PPROM and potential for preterm delivery. I had the pleasure of meeting with Christine Greene today.   She is a 38 yo [id number] female, currently [redacted]w[redacted]d, who presented at [redacted]w[redacted]d with previable PPROM, confirmed with +ferning and US  with oligohydramnios. Pregnancy has also been complicated by AMA, low risk NIPS with XX predicted fetus, subchorionic hematoma, history of multiple losses, and history of preterm twins (33wks). She is not in active labor. She has not received BMZ given previable gestational age. She is receiving latency antibiotics. Most recent estimated fetal weight of 293 grams (55%ile) on 6/2. She has chosen to name her baby "Alora".   She has been counseled regarding risks to fetus and to herself of remaining pregnant and has opted for latency antibiotics and expectant management through viability. She is due to complete 48h of inpatient parenteral antibiotics this afternoon and will continue oral antibiotics at home to complete 7d course.   Upon our meeting, Christine Greene informed me of the fact that she has a strong faith and is aware that there are significant risks associated with previable PPROM, but is also aware that there are miracle babies in the NICU and is hopeful the team will be willing to at least try to help save her daughter. I affirmed that, at viability, we can offer resuscitation for her daughter.   We discussed the current limits of viability and the rationale for those limits, including and not limited to size constraints with available equipment and fetal maturity, particularly with regards to lung development. We discussed that there is not a set size at which resuscitation is possible as airway anatomy may be variable, although factors such as growth restriction at any given GA worsen chances of successful  resuscitation. We discussed that our practice currently would limit resuscitative efforts to infants at least [redacted]w[redacted]d gestation, however there are other local level IV NICUs that offer resuscitation down to [redacted]w[redacted]d.   We discussed the high risk of mortality for an infant delivered in the periviable period (22-[redacted]wks GA), with survival estimates at 22wks being as low as ~30%, and with previable PPROM worsening chance of survival further. We discussed that of those infants that do survive, the majority have at least some degree of neurodevelopmental impairment, which can vary from mild developmental delays to profound cerebral palsy.   We discussed the unpredictable nature of previable PPROM and risk of pulmonary hypoplasia. We discussed the crucial role of amniotic fluid in fetal pulmonary development, but noted that while some infant's seem to have dramatically slowed pulmonary development at time of PPROM, other infants may continue to develop sufficiently to support postnatal respiratory needs. Thus, some infants who may remain in utero even beyond the peri-viable period may still suffer great morbidity and mortality as their pulmonary development is more similar to a pre- or peri- viable infant. We discussed that pulmonary hypoplasia or extreme prematurity may result in need for extensive support including the potential for tracheostomy and ventilator dependence if survival is possible.   Christine Greene acknowledged these risks and continued to affirm her hope and faith that we give her infant a chance, should she be delivered at a viable GA. I confirmed that we would offer resuscitation at [redacted]w[redacted]d.   We then discussed what a trial of intensive care may look like. We  reviewed that resuscitating in the delivery room does not then commit the family to a path in which they must always choose to continue aggressive medical interventions for Alora, should they not continue to align with their family goals. I acknowledged  that each family may have a different threshold for the outcomes they would consider acceptable. In particular, when discussing neurodevelopmental outcomes of extremely preterm infants, there is increasing literature that parents rate neurodevelopmental impairment less severe than physicians. Mom affirmed that she felt risk of a disability would not be a reason for her to decline a trial of resuscitation or withdraw support from her child. We also discussed that we would not want to reach a point in which further intervention is of minimal benefit to her child, and she agreed that she did not want her child to suffer. She recently had to make decisions regarding transition to comfort care for her father, who passed ~2wks ago, and is understanding that there may come a time that a transition to comfort care is in her child's best interest.   All of Christine Greene's questions for today were answered. We discussed a plan to have a repeat consultation to discuss more details of a typical NICU course whenever she is readmitted with potential for further updates should she remain pregnant to later gestational ages.   Given her strong desire for a trial of resuscitation at delivery, I strongly recommend that she is admitted at [redacted]w[redacted]d to begin BMZ so she may be steroid complete at [redacted]w[redacted]d when resuscitation would be offered.   Thank you for involving us  in the care of this patient. A member of our team will be available should the family have additional questions.   Christine Burdell, MD Neonatal Medicine  I spent ~40 minutes in consultation time, of which 25 minutes was spent in direct face to face counseling.

## 2023-12-03 NOTE — Discharge Summary (Signed)
 Physician Discharge Summary  Patient ID: Christine Greene MRN: [id number] DOB/AGE: [birth year] 38 y.o.  Admit date: 12/01/2023 Discharge date: 12/03/2023  Admission Diagnoses: Previable PROM at19 2/7wks  Advanced maternal age gravida History of recurrent pregnancy loss including ectopic pregnancy History of gestational hypertension  History of preterm labor   Discharge Diagnoses: Same  Principal Problem:   Preterm premature rupture of membranes (PPROM) with unknown onset of labor   Discharged Condition: stable  Hospital Course: Pt was admitted and treated for two days with IV latency antibiotics. US  at admission and on day of discharge confirmed +FHTs and vertex position Pt met with MFM and Neonatology during her stay and was counseled regarding implications of previable PROM and risks. Plan established for outpatient continuation of latency antibiotics and follow up visits. Pt to be readmitted at 22 5/7wks for steriod administration to ensure completion by 23 weeks.   Consults: maternal fetal medicine and neonatology   Significant Diagnostic Studies: ultrasound   Treatments: IV hydration, antibiotics: ampicillin  and amoxicillin, and analgesia: acetaminophen , stadol  and fioricet   Discharge Exam: Blood pressure 138/77, pulse 93, temperature 98.1 F (36.7 C), temperature source Oral, resp. rate 15, height 5' (1.524 m), weight 92.7 kg, last menstrual period 07/19/2023, SpO2 98%. General appearance: alert, cooperative, and no distress GI: soft, nontender, CW gestational age  Pelvic: external genitalia normal, no cervical motion tenderness, and cervix closed. Clear amniotic fluid only , no odor or bleeding  Extremities: Homans sign is negative, no sign of DVT Pulses: 2+ and symmetric  Disposition: Discharge disposition: 01-Home or Self Care       Discharge Instructions     Call MD for:  difficulty breathing, headache or visual disturbances   Complete by: As directed    Call  MD for:  persistant dizziness or light-headedness   Complete by: As directed    Call MD for:  persistant nausea and vomiting   Complete by: As directed    Call MD for:  severe uncontrolled pain   Complete by: As directed    Call MD for:  temperature >100.4   Complete by: As directed    Diet - low sodium heart healthy   Complete by: As directed    Discharge instructions   Complete by: As directed    Call office with any concerns [phone]   Driving Restrictions   Complete by: As directed    None ; may ride as passenger   Lifting restrictions   Complete by: As directed    No lifting >5lbs   Other Restrictions   Complete by: As directed    May ambulate but no walking long distances or vigorous activity   Sexual Activity Restrictions   Complete by: As directed    None till delivery      Allergies as of 12/03/2023       Reactions   Chlorhexidine  Gluconate Itching        Medication List     TAKE these medications    amoxicillin 250 MG capsule Commonly known as: AMOXIL Take 1 capsule (250 mg total) by mouth 3 (three) times daily.   azithromycin 500 MG tablet Commonly known as: ZITHROMAX Take 2 tablets (1,000 mg total) by mouth daily for 5 days.   buPROPion 150 MG 24 hr tablet Commonly known as: WELLBUTRIN XL Take 150 mg by mouth daily.   cyclobenzaprine  5 MG tablet Commonly known as: FLEXERIL  Take 1 tablet (5 mg total) by mouth 3 (three) times daily as  needed.   DULoxetine 60 MG capsule Commonly known as: CYMBALTA Take 60 mg by mouth daily.   magnesium  oxide 400 MG tablet Commonly known as: MAG-OX Take 1 tablet (400 mg total) by mouth daily.   metoCLOPramide  10 MG tablet Commonly known as: REGLAN  Take 1 tablet (10 mg total) by mouth every 6 (six) hours as needed for nausea or vomiting.   multivitamin-prenatal 27-0.8 MG Tabs tablet Take 1 tablet by mouth daily at 12 noon.   ondansetron  4 MG tablet Commonly known as: ZOFRAN  Take 1 tablet (4 mg  total) by mouth every 6 (six) hours as needed for nausea.   pantoprazole  20 MG tablet Commonly known as: PROTONIX  Take 1 tablet (20 mg total) by mouth daily.   scopolamine  1 MG/3DAYS Commonly known as: TRANSDERM-SCOP Place 1 patch (1.5 mg total) onto the skin every 3 (three) days.   traZODone 100 MG tablet Commonly known as: DESYREL Take 100 mg by mouth at bedtime.        Follow-up Information     Ob/Gyn, Tita Form. Go to.   Why: 12/11/23 for scheduled appointment but note sent to office to see if can change to 12/12/2023 instead Contact information: [address] [place] [address] [phone]         Elgin Grit Kathlean Pao, MD Follow up on 12/12/2023.   Specialty: Obstetrics Why: at 11am with Maternal Fetal Medicine Contact information: [address] [place] [address] [phone]         Marce Sensing, MD .   Specialty: Ophthalmology Contact information: [address] [place] [address] [phone]                 Signed: Levis Reams 12/03/2023, 6:23 PM

## 2023-12-03 NOTE — Discharge Instructions (Signed)
 Call office with any concerns [phone]

## 2023-12-03 NOTE — Progress Notes (Addendum)
 Patient ID: Christine Greene, female   DOB: Oct 08, 1985, 38 y.o.   MRN: [id number] Pt reports increased vaginal pressure and leakage of fluid this am. Her HA has improved - considers it "mild" now at 4/10. Declines tylenol  or fioricet for pain relief - tolerable.  She also denies fever or chills. Tolerating PO Pt with multiple questions related to infant resuscitation.She states " if she ( baby ) is fighting then I have to fight for her too).. 17yo daughter at bedside   Vitals:   12/03/23 0324 12/03/23 0850  BP: 129/69 120/63  Pulse: 93 85  Resp: 20 18  Temp: 98.1 F (36.7 C) 98.1 F (36.7 C)  SpO2: 100%    GEN : NAD  ABD - soft, no palpable contractions SVE - cervix closed , clear fluid noted EXT - no homans   Bedside US  : vertex, + FHR 142, oligo   8.4>11.5< 177  A/P:  86VH [id number] female at 52 4/7wks with previable preterm ROM, vertex with +FHTs present , afebrile - Cervix closed - WBC in normal range  - EFW 293gm ( 10oz, 55%ile) on 12/01/2023 - Oligohydramnious - still present today per bedside US ; for MFM scan today.  - On latency antibiotics - will complete IV dose today then transition to PO for an additional 5 days ( may complete outpt) - Severe anxiety - on trazodone, cymbalta, wellbutrin, and then atarax  prn. Received one dose of stadol  last night   - NICU consult requested  - MFM appt at end of next week for repeat US  for fetal evaluation once discharged. MFM to speak to her again today  - Likely discharge to home today vs tomorrow - Plan for readmission at 23 weeks if still pregnant and viable - Deliver at 34 weeks

## 2023-12-03 NOTE — Consult Note (Signed)
 MFM Consult Note  Christine Greene is currently at 19 weeks and 4 days.  She has been admitted due to previable PPROM.    She has been observed in the hospital over the past 2 days to receive the IV portion of latency antibiotics.    I connected with the patient via telephone today.  She reports continued leakage of fluid but denies feeling any lower abdominal cramping or contractions.    An ultrasound performed this morning shows that the fetus is in the vertex presentation. Oligohydramnios with a 2 cm pocket of fluid was visualized.  Fetal movements and fetal breathing movements were noted during today's exam.    As the patient remains stable, she will likely be discharged home later this afternoon.   She should complete the 5-day course of oral latency antibiotics once she is discharged.   Precautions due to PPROM were reviewed today.  She was advised to call should she experience heavy vaginal bleeding, significant lower abdominal cramping, should she have a foul-smelling discharge, or fevers or shaking chills indicating a possible intra-uterine infection.    I have made arrangements for the patient to come to our office next Friday, June 13 at 11 AM for another ultrasound exam for fetal assessment.    The patient understands that we are still a few weeks away from viability.  Hopefully, she will remain undelivered for the next few weeks.    We will plan on readmission to the hospital and the administration of full course of steroids should she remain undelivered at 23 weeks.   The patient stated that all of her questions were answered today.

## 2023-12-03 NOTE — Progress Notes (Signed)
 Patient ID: Christine Greene, female   DOB: 10/26/1985, 38 y.o.   MRN: [id number] Pt had a consult with Dr. Grayland Le MFM over the phone and in person with Dr Truddie Furrow, Neonatologist  Vertex confirmed and FHTs once again. Oligohydramnious 2cm  She reports all of her questions have been answered and feels well about being discharged to home.   She will start on PO antibiotics tomorrow - rx sent  Has appt with [facility] on 12/11/23 Has appt with MFM on 12/12/23 at 11am  She would like to have both appts on same day if possible - I will notify [facility] to see if can coordinate for her  Per discussion with Dr Truddie Furrow, pt may be readmitted at 22 5/7wks instead of 23 weeks in order to complete BMZ by [redacted] weeks gestation.  I answered other lifestyle questions for pt.   Discharge to home

## 2023-12-05 ENCOUNTER — Other Ambulatory Visit: Payer: Self-pay | Admitting: *Deleted

## 2023-12-05 ENCOUNTER — Inpatient Hospital Stay (HOSPITAL_COMMUNITY)
Admission: AD | Admit: 2023-12-05 | Discharge: 2023-12-05 | Disposition: A | Discharge status: Home / Self Care | Payer: Self-pay | Attending: Obstetrics and Gynecology | Admitting: Obstetrics and Gynecology

## 2023-12-05 ENCOUNTER — Inpatient Hospital Stay (HOSPITAL_COMMUNITY)

## 2023-12-05 ENCOUNTER — Encounter (HOSPITAL_COMMUNITY): Payer: Self-pay | Admitting: Obstetrics and Gynecology

## 2023-12-05 DIAGNOSIS — Z3A19 19 weeks gestation of pregnancy: Secondary | ICD-10-CM | POA: Diagnosis not present

## 2023-12-05 DIAGNOSIS — M25511 Pain in right shoulder: Secondary | ICD-10-CM | POA: Diagnosis not present

## 2023-12-05 DIAGNOSIS — O99891 Other specified diseases and conditions complicating pregnancy: Secondary | ICD-10-CM | POA: Diagnosis not present

## 2023-12-05 DIAGNOSIS — R0602 Shortness of breath: Secondary | ICD-10-CM | POA: Diagnosis not present

## 2023-12-05 DIAGNOSIS — O26892 Other specified pregnancy related conditions, second trimester: Secondary | ICD-10-CM

## 2023-12-05 DIAGNOSIS — K449 Diaphragmatic hernia without obstruction or gangrene: Secondary | ICD-10-CM | POA: Insufficient documentation

## 2023-12-05 DIAGNOSIS — O42919 Preterm premature rupture of membranes, unspecified as to length of time between rupture and onset of labor, unspecified trimester: Secondary | ICD-10-CM

## 2023-12-05 DIAGNOSIS — O99612 Diseases of the digestive system complicating pregnancy, second trimester: Secondary | ICD-10-CM | POA: Diagnosis not present

## 2023-12-05 LAB — CBC WITH DIFFERENTIAL/PLATELET
Abs Immature Granulocytes: 0.07 10*3/uL (ref 0.00–0.07)
Basophils Absolute: 0 10*3/uL (ref 0.0–0.1)
Basophils Relative: 0 %
Eosinophils Absolute: 0.1 10*3/uL (ref 0.0–0.5)
Eosinophils Relative: 1 %
HCT: 36.2 % (ref 36.0–46.0)
Hemoglobin: 12.3 g/dL (ref 12.0–15.0)
Immature Granulocytes: 1 %
Lymphocytes Relative: 20 %
Lymphs Abs: 1.9 10*3/uL (ref 0.7–4.0)
MCH: 30.7 pg (ref 26.0–34.0)
MCHC: 34 g/dL (ref 30.0–36.0)
MCV: 90.3 fL (ref 80.0–100.0)
Monocytes Absolute: 0.8 10*3/uL (ref 0.1–1.0)
Monocytes Relative: 8 %
Neutro Abs: 6.8 10*3/uL (ref 1.7–7.7)
Neutrophils Relative %: 70 %
Platelets: 184 10*3/uL (ref 150–400)
RBC: 4.01 MIL/uL (ref 3.87–5.11)
RDW: 14 % (ref 11.5–15.5)
WBC: 9.7 10*3/uL (ref 4.0–10.5)
nRBC: 0 % (ref 0.0–0.2)

## 2023-12-05 LAB — COMPREHENSIVE METABOLIC PANEL WITH GFR
ALT: 13 U/L (ref 0–44)
AST: 17 U/L (ref 15–41)
Albumin: 3 g/dL — ABNORMAL LOW (ref 3.5–5.0)
Alkaline Phosphatase: 54 U/L (ref 38–126)
Anion gap: 9 (ref 5–15)
BUN: <5 mg/dL — ABNORMAL LOW (ref 6–20)
CO2: 23 mmol/L (ref 22–32)
Calcium: 9 mg/dL (ref 8.9–10.3)
Chloride: 101 mmol/L (ref 98–111)
Creatinine, Ser: 0.64 mg/dL (ref 0.44–1.00)
GFR, Estimated: >60 mL/min (ref >60)
Glucose, Bld: 88 mg/dL (ref 70–99)
Potassium: 3.4 mmol/L — ABNORMAL LOW (ref 3.5–5.1)
Sodium: 133 mmol/L — ABNORMAL LOW (ref 135–145)
Total Bilirubin: 0.2 mg/dL (ref 0.0–1.2)
Total Protein: 6.2 g/dL — ABNORMAL LOW (ref 6.5–8.1)

## 2023-12-05 LAB — BRAIN NATRIURETIC PEPTIDE: B Natriuretic Peptide: 5.6 pg/mL (ref 0.0–100.0)

## 2023-12-05 MED ORDER — ACETAMINOPHEN 500 MG PO TABS
1000.0000 mg | ORAL_TABLET | Freq: Once | ORAL | Status: AC
  Administered 2023-12-05: 1000 mg via ORAL
  Filled 2023-12-05: qty 2

## 2023-12-05 MED ORDER — ALBUTEROL SULFATE (2.5 MG/3ML) 0.083% IN NEBU
2.5000 mg | INHALATION_SOLUTION | Freq: Once | RESPIRATORY_TRACT | Status: AC
  Administered 2023-12-05: 2.5 mg via RESPIRATORY_TRACT
  Filled 2023-12-05: qty 3

## 2023-12-05 MED ORDER — IOHEXOL 350 MG/ML SOLN
75.0000 mL | Freq: Once | INTRAVENOUS | Status: AC | PRN
  Administered 2023-12-05: 75 mL via INTRAVENOUS

## 2023-12-05 MED ORDER — CYCLOBENZAPRINE HCL 10 MG PO TABS: 10.0000 mg | ORAL_TABLET | Freq: Three times a day (TID) | ORAL | 2 refills | Status: AC | PRN

## 2023-12-05 MED ORDER — CYCLOBENZAPRINE HCL 5 MG PO TABS
10.0000 mg | ORAL_TABLET | Freq: Once | ORAL | Status: AC
  Administered 2023-12-05: 10 mg via ORAL
  Filled 2023-12-05: qty 2

## 2023-12-05 MED ORDER — MORPHINE SULFATE (PF) 4 MG/ML IV SOLN
4.0000 mg | Freq: Once | INTRAVENOUS | Status: AC
  Administered 2023-12-05: 4 mg via INTRAVENOUS
  Filled 2023-12-05: qty 1

## 2023-12-05 MED ORDER — CYCLOBENZAPRINE HCL 10 MG PO TABS: 10.0000 mg | ORAL_TABLET | Freq: Two times a day (BID) | ORAL | 0 refills | Status: AC | PRN

## 2023-12-05 MED ORDER — ONDANSETRON HCL 4 MG/2ML IJ SOLN
4.0000 mg | Freq: Once | INTRAMUSCULAR | Status: AC
Start: 2023-12-05 — End: 2023-12-05
  Administered 2023-12-05: 4 mg via INTRAVENOUS
  Filled 2023-12-05: qty 2

## 2023-12-05 NOTE — MAU Note (Signed)
 Resp here for neb treatment

## 2023-12-05 NOTE — Discharge Instructions (Signed)
 Christine Greene

## 2023-12-05 NOTE — MAU Provider Note (Addendum)
 History     CSN: [id number]  Arrival date and time: 12/05/23 9147   Event Date/Time   First Provider Initiated Contact with Patient 12/05/23 0413      Chief Complaint  Patient presents with   Shortness of Breath   Shoulder Pain   Christine Greene is a 38 y.o. [id number] at [redacted]w[redacted]d who receives care at [facility].  She presents today for SOB and shoulder pain.  Patient states symptoms started around 1500 and have been constant.  She states she took tylenol  at 1630 and used a TENS for about one hour at 1900 with no relief of shoulder pain.  She describes the pain as "knife in the back" and rates it a 7/10.  She denies worsening factors for the pain.  She states the SOB has been present with the pain. No history of asthma.  The pregnancy history is significant for pPROM x 4 days.  She denies abdominal pain, vaginal bleeding or odor.     OB History     Gravida  12   Para  3   Term  2   Preterm  1   AB  8   Living  4      SAB  7   IAB  0   Ectopic  1   Multiple  1   Live Births  4           Past Medical History:  Diagnosis Date   Anxiety    Depression    Goiter    history of Anemia    PIH (pregnancy induced hypertension) 09/05/2016   PONV (postoperative nausea and vomiting)    Post-operative state 12/18/2022   Postoperative state 12/16/2022   Preterm labor    Retained products of conception after miscarriage 12/17/2022   Spontaneous vaginal delivery 09/05/2016   Threatened preterm labor, third trimester 08/14/2016    Past Surgical History:  Procedure Laterality Date   APPENDECTOMY  2008   DILATION AND CURETTAGE OF UTERUS     DILATION AND EVACUATION N/A 12/16/2022   Procedure: DILATATION AND EVACUATION;  Surgeon: Matt Song, MD;  Location: [facility] [place];  Service: Gynecology;  Laterality: N/A;   DILATION AND EVACUATION N/A 12/18/2022   Procedure: DILATATION AND EVACUATION;  Surgeon: Matt Song, MD;  Location: [facility];  Service:  Gynecology;  Laterality: N/A;   ECTOPIC PREGNANCY SURGERY  2007   LAPAROSCOPY FOR ECTOPIC PREGNANCY     VAGINAL DELIVERY  11/05/2011   Procedure: VAGINAL DELIVERY;  Surgeon: Hamp Levine, MD;  Location: [facility];  Service: Gynecology;  Laterality: N/A;    Family History  Problem Relation Age of Onset   Healthy Mother    Diabetes Father     Social History   Tobacco Use   Smoking status: Never   Smokeless tobacco: Never  Vaping Use   Vaping status: Never Used  Substance Use Topics   Alcohol use: No   Drug use: No    Allergies:  Allergies  Allergen Reactions   Chlorhexidine  Gluconate Itching    Medications Prior to Admission  Medication Sig Dispense Refill Last Dose/Taking   amoxicillin (AMOXIL) 250 MG capsule Take 1 capsule (250 mg total) by mouth 3 (three) times daily. 15 capsule 0    azithromycin (ZITHROMAX) 500 MG tablet Take 2 tablets (1,000 mg total) by mouth daily for 5 days. 5 tablet 0    buPROPion (WELLBUTRIN XL) 150 MG 24 hr tablet Take 150 mg by mouth daily.  cyclobenzaprine  (FLEXERIL ) 5 MG tablet Take 1 tablet (5 mg total) by mouth 3 (three) times daily as needed. 20 tablet 0    DULoxetine (CYMBALTA) 60 MG capsule Take 60 mg by mouth daily.      magnesium  oxide (MAG-OX) 400 MG tablet Take 1 tablet (400 mg total) by mouth daily. 30 tablet 9    metoCLOPramide  (REGLAN ) 10 MG tablet Take 1 tablet (10 mg total) by mouth every 6 (six) hours as needed for nausea or vomiting. 20 tablet 0    ondansetron  (ZOFRAN ) 4 MG tablet Take 1 tablet (4 mg total) by mouth every 6 (six) hours as needed for nausea. 20 tablet 0    pantoprazole  (PROTONIX ) 20 MG tablet Take 1 tablet (20 mg total) by mouth daily. 30 tablet 2    Prenatal Vit-Fe Fumarate-FA (MULTIVITAMIN-PRENATAL) 27-0.8 MG TABS tablet Take 1 tablet by mouth daily at 12 noon.      scopolamine  (TRANSDERM-SCOP) 1 MG/3DAYS Place 1 patch (1.5 mg total) onto the skin every 3 (three) days. 10 patch 0    traZODone (DESYREL) 100  MG tablet Take 100 mg by mouth at bedtime.       Review of Systems  Constitutional:  Negative for chills and fever.  Respiratory:  Positive for shortness of breath. Negative for cough.   Gastrointestinal:  Negative for abdominal pain, nausea and vomiting.  Genitourinary:  Negative for difficulty urinating, dysuria, vaginal bleeding and vaginal discharge.  Musculoskeletal:  Positive for back pain (Right Shoulder Area).   Physical Exam   Blood pressure 132/75, pulse 97, temperature 98.1 F (36.7 C), resp. rate 17, height 5' (1.524 m), weight 92.1 kg, last menstrual period 07/19/2023, SpO2 99%.  Physical Exam Vitals reviewed.  Constitutional:      General: She is not in acute distress.    Appearance: Normal appearance. She is well-developed.  HENT:     Head: Normocephalic and atraumatic.  Eyes:     Conjunctiva/sclera: Conjunctivae normal.  Cardiovascular:     Rate and Rhythm: Normal rate and regular rhythm.     Heart sounds: Normal heart sounds.  Pulmonary:     Effort: Pulmonary effort is normal.     Breath sounds: Examination of the right-upper field reveals decreased breath sounds. Examination of the right-middle field reveals decreased breath sounds. Examination of the right-lower field reveals decreased breath sounds. Decreased breath sounds present. No wheezing, rhonchi or rales.  Musculoskeletal:        General: Normal range of motion.     Cervical back: Normal range of motion.  Skin:    General: Skin is warm and dry.  Neurological:     Mental Status: She is alert and oriented to person, place, and time.  Psychiatric:        Mood and Affect: Mood normal.        Behavior: Behavior normal.     MAU Course  Procedures Results for orders placed or performed during the hospital encounter of 12/05/23 (from the past 24 hours)  CBC with Differential/Platelet     Status: None   Collection Time: 12/05/23  4:39 AM  Result Value Ref Range   WBC 9.7 4.0 - 10.5 K/uL   RBC 4.01  3.87 - 5.11 MIL/uL   Hemoglobin 12.3 12.0 - 15.0 g/dL   HCT 16.1 09.6 - 04.5 %   MCV 90.3 80.0 - 100.0 fL   MCH 30.7 26.0 - 34.0 pg   MCHC 34.0 30.0 - 36.0 g/dL   RDW 40.9 81.1 -  15.5 %   Platelets 184 150 - 400 K/uL   nRBC 0.0 0.0 - 0.2 %   Neutrophils Relative % 70 %   Neutro Abs 6.8 1.7 - 7.7 K/uL   Lymphocytes Relative 20 %   Lymphs Abs 1.9 0.7 - 4.0 K/uL   Monocytes Relative 8 %   Monocytes Absolute 0.8 0.1 - 1.0 K/uL   Eosinophils Relative 1 %   Eosinophils Absolute 0.1 0.0 - 0.5 K/uL   Basophils Relative 0 %   Basophils Absolute 0.0 0.0 - 0.1 K/uL   Immature Granulocytes 1 %   Abs Immature Granulocytes 0.07 0.00 - 0.07 K/uL  Comprehensive metabolic panel     Status: Abnormal   Collection Time: 12/05/23  4:39 AM  Result Value Ref Range   Sodium 133 (L) 135 - 145 mmol/L   Potassium 3.4 (L) 3.5 - 5.1 mmol/L   Chloride 101 98 - 111 mmol/L   CO2 23 22 - 32 mmol/L   Glucose, Bld 88 70 - 99 mg/dL   BUN <5 (L) 6 - 20 mg/dL   Creatinine, Ser 1.61 0.44 - 1.00 mg/dL   Calcium  9.0 8.9 - 10.3 mg/dL   Total Protein 6.2 (L) 6.5 - 8.1 g/dL   Albumin 3.0 (L) 3.5 - 5.0 g/dL   AST 17 15 - 41 U/L   ALT 13 0 - 44 U/L   Alkaline Phosphatase 54 38 - 126 U/L   Total Bilirubin 0.2 0.0 - 1.2 mg/dL   GFR, Estimated >09 >60 mL/min   Anion gap 9 5 - 15  Brain natriuretic peptide     Status: None   Collection Time: 12/05/23  4:39 AM  Result Value Ref Range   B Natriuretic Peptide 5.6 0.0 - 100.0 pg/mL   CT Angio Chest PE W and/or Wo Contrast Result Date: 12/05/2023 CLINICAL DATA:  Rule out pulmonary embolism. Right shoulder pain. Twenty weeks pregnant. Shortness of breath. EXAM: CT ANGIOGRAPHY CHEST WITH CONTRAST TECHNIQUE: Multidetector CT imaging of the chest was performed using the standard protocol during bolus administration of intravenous contrast. Multiplanar CT image reconstructions and MIPs were obtained to evaluate the vascular anatomy. RADIATION DOSE REDUCTION: This exam was  performed according to the departmental dose-optimization program which includes automated exposure control, adjustment of the mA and/or kV according to patient size and/or use of iterative reconstruction technique. CONTRAST:  75mL OMNIPAQUE  IOHEXOL  350 MG/ML SOLN COMPARISON:  None Available. FINDINGS: Cardiovascular: Satisfactory opacification of the pulmonary arteries to the segmental level. No evidence of pulmonary embolism. Normal heart size. No pericardial effusion. Mediastinum/Nodes: There is asymmetric enlargement of the left lobe of thyroid  gland due to underlying 3.9 cm thyroid  nodule. This has been evaluated on previous imaging. (ref: J Am Coll Radiol. 2015 Feb;12(2): 143-50). Trachea appears patent and midline. Normal appearance of the esophagus. Small hiatal hernia. No mediastinal or hilar adenopathy. Lungs/Pleura: The central airways are patent. No pleural fluid, interstitial edema, or airspace disease. Mosaic attenuation pattern. No suspicious pulmonary nodule or mass. Upper Abdomen: No acute abnormality. Musculoskeletal: No chest wall abnormality. No acute or significant osseous findings. Review of the MIP images confirms the above findings. IMPRESSION: 1. No evidence for acute pulmonary embolus. 2. Mosaic attenuation pattern of the lungs is nonspecific but can be seen in the setting of small airways disease. 3. Small hiatal hernia. Electronically Signed   By: Kimberley Penman M.D.   On: 12/05/2023 07:07     MDM Start IV Labs: CBC/D, CMP, BNP Muscle Relaxant/Pain Medication  EKG CT Scan Nebulizer Assessment and Plan  38  year old, [id number]  SIUP at 19.6 weeks SOB Shoulder Pain  -Reviewed POC with patient. -Patient declines XRay and requests to have just CT Scan. -Reviewed risks and consent form given for patient review.  -Exam performed and findings discussed.  -Informed that if CT returns normal will complete albuterol treatment.  -Labs ordered. -EKG ordered and returns  normal. -Patient offered and accepts pain medication. Flexeril  and tylenol  ordered. -Apply heat to area.  -Await results.   Kraig Peru 12/05/2023, 4:13 AM   Reassessment (5:35 AM) -Patient reports no relief from flexeril  and tylenol . -IV morphine  ordered. -Labs return without significant findings. D-Dimer added. -Awaiting to send to CT.   Reassessment (7:30 AM) -Patient informed of CT results. -Discussed small airway disease which would be consistent with decreased breath sounds noted on exam. -Informed that we would proceed with nebulizer treatment and patient agreeable. -Patient requests and ordered additional morphine  for 6/10 shoulder pain.  -Patient requests that Dr. Emma Hardy be contacted regarding admission d/t pPROM. -Informed that no current indication, but provider will contact primary. -Dr. Veleria Germany consulted and informed of patient status, evaluation, interventions, and results. Apologies and appreciation expressed as she was no longer on call, but agreeable to take call. Advised: *In absence of pregnancy related complications and no PE, no current indication for admission. -Patient informed of primary recommendation. -Precautions reviewed. -Discussed sending rx for flexeril  to pharmacy for home use.  Encouraged to rest, hydrate, and use heat in area of shoulder pain.  -Nebulizer treatment in process. -Dr. Don Fritter updated in case additional interventions necessary. -Encouraged to call primary office or return to MAU if symptoms worsen or with the onset of new symptoms. -Discharged to home after completion of nebulizer.  Kraig Peru MSN, CNM Advanced Practice Provider, Center for [facility]   Reassessment: Point tenderness over right rhomboid with reproduction of patients sharp pain.   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  Heat or  Ice whichever is more soothing Partner shown where to gently apply Icy/Hot for muscle spasm relief.   Flexeril  Rx sent to pharmacy.   Discharge home. Return precautions provided.   [place] End, MD FMOB Fellow, Faculty practice [facility], Center for [facility]

## 2023-12-05 NOTE — MAU Note (Signed)
 Christine Greene is a 38 y.o. at [redacted]w[redacted]d here in MAU reporting R shoulder pain and SOB since 1500 Thurs. Worse with movement or when she takes a deep breath. Has tried Tylenol  and TENS unit with minimal relief.   LMP: na Onset of complaint: 1500 Thurs Pain score: 7 Vitals:   12/05/23 0350 12/05/23 0351  BP:  132/75  Pulse: 97   Resp: 17   Temp: 98.1 F (36.7 C)   SpO2: 99%      FHT: 152  Lab orders placed from triage: EKG

## 2023-12-05 NOTE — MAU Note (Signed)
 Pt's husband had stepped out.  Pt saying pain medication has worn off.  CT results are now back, reviewed with [facility] CNM.

## 2023-12-05 NOTE — MAU Note (Signed)
 Informed consent for procedures with radiation exposure during pregnancy signed by patient and provider.

## 2023-12-12 ENCOUNTER — Ambulatory Visit

## 2023-12-12 ENCOUNTER — Other Ambulatory Visit

## 2023-12-15 ENCOUNTER — Other Ambulatory Visit: Payer: Self-pay | Admitting: *Deleted

## 2023-12-15 ENCOUNTER — Ambulatory Visit

## 2023-12-15 ENCOUNTER — Other Ambulatory Visit: Payer: Self-pay | Admitting: Obstetrics

## 2023-12-15 ENCOUNTER — Ambulatory Visit: Attending: Obstetrics and Gynecology | Admitting: Maternal & Fetal Medicine

## 2023-12-15 VITALS — BP 118/83 | HR 112

## 2023-12-15 DIAGNOSIS — O42919 Preterm premature rupture of membranes, unspecified as to length of time between rupture and onset of labor, unspecified trimester: Secondary | ICD-10-CM

## 2023-12-15 DIAGNOSIS — O42913 Preterm premature rupture of membranes, unspecified as to length of time between rupture and onset of labor, third trimester: Secondary | ICD-10-CM

## 2023-12-15 DIAGNOSIS — O09212 Supervision of pregnancy with history of pre-term labor, second trimester: Secondary | ICD-10-CM | POA: Diagnosis not present

## 2023-12-15 DIAGNOSIS — O42912 Preterm premature rupture of membranes, unspecified as to length of time between rupture and onset of labor, second trimester: Secondary | ICD-10-CM | POA: Diagnosis present

## 2023-12-15 DIAGNOSIS — O321XX0 Maternal care for breech presentation, not applicable or unspecified: Secondary | ICD-10-CM | POA: Diagnosis not present

## 2023-12-15 DIAGNOSIS — Z3A21 21 weeks gestation of pregnancy: Secondary | ICD-10-CM | POA: Insufficient documentation

## 2023-12-15 DIAGNOSIS — O99212 Obesity complicating pregnancy, second trimester: Secondary | ICD-10-CM | POA: Diagnosis not present

## 2023-12-15 DIAGNOSIS — O09522 Supervision of elderly multigravida, second trimester: Secondary | ICD-10-CM | POA: Diagnosis not present

## 2023-12-15 DIAGNOSIS — Z363 Encounter for antenatal screening for malformations: Secondary | ICD-10-CM | POA: Insufficient documentation

## 2023-12-15 DIAGNOSIS — O09292 Supervision of pregnancy with other poor reproductive or obstetric history, second trimester: Secondary | ICD-10-CM

## 2023-12-15 NOTE — Progress Notes (Unsigned)
 Patient information  Patient Name: Christine Greene  Patient MRN:   [id number]  Referring practice: {MFM Referring Provider:[id number]}  Problem List   Patient Active Problem List   Diagnosis Date Noted   Preterm premature rupture of membranes (PPROM) with unknown onset of labor 12/01/2023   History of gestational hypertension 11/27/2023   History of multiple spontaneous abortions 11/27/2023   AMA (advanced maternal age) multigravida 35+ 11/27/2023   Obesity affecting pregnancy 11/27/2023   RhD negative 11/07/2023   Hyperemesis 10/03/2023   Postpartum depression 01/16/2023   Suicidal ideation 01/16/2023    Maternal Fetal Medicine Consult Christine Greene is a 38 y.o. [id number] at [redacted]w[redacted]d here for ultrasound and consultation. She had Low risk of a female fetus.  Today we focused on the following:   PPROM: The patient presented to MAU with positive ROM based on a fern test and clinical leakage of fluid.  An MFM consult was performed and the plan to have steroids at 22 weeks 5 days with admission at 23 weeks was made.  I discussed the prognosis of a 23-week fetus in the setting of P PROM is very poor if delivery were to be required.  We discussed that it is reasonable to delay admission until 24 weeks or even possibly later to allow for a better prognosis if delivery was required in addition to allowing more time as an outpatient due to family needs.  She is an active mother with a number of children and she is going to consider if it is best for her to be admitted versus waiting as an outpatient 1 more week.  I have scheduled her a follow-up ultrasound in 2 weeks just in the event that she would like to delay admission until 24 weeks.  The patient reports that she is leaking less fluid but still having some loss of fluid overall.  She denies fevers, chills or cramping.  She knows to go to the hospital with any concerns.  I discussed that the ultrasound is limited due to low fluid but the anatomic  structures do appear normal.  We discussed the main concern with PPROM less than 22 weeks is a combination of pulmonary hypoplasia which may be fatal in addition to Whitlash sequence which can cause significant deformities due to the lack of fluid and the compressive forces of the uterus.  The patient and her husband verbalized they understand the prognosis is challenging but strongly desire to continue the pregnancy.  I verbalized that I am very supportive of this and we will gladly continue to take care of her while she decides whether she would like to be inpatient or outpatient.  Ultrasound findings ***  Recommendations - ***Aneuploidy screening if not previously completed. - Anatomy ultrasound was done today with the above findings (see report). - Aspirin 81-162 mg from 12 weeks and continued throughout the pregnancy for preeclampsia prophylaxis. - Baseline labs: CMP, CBC, urine protein creatinine ratio. - Blood pressure goal of < 140 systolic and < 90 diastolic. Antihypertensive medication should be added/adjusted until BP goal is achieved.  - ***Early Glucola screening (or A1c of patient declines). - ***Baseline EKG. - ***Maternal Echocardiogram if EKG is abnormal or if hypertension is >5 years or patient develops concerning symptoms of heart failure. - ***Referral to diabetic education. - ***Fetal echocardiogram due to increased risk of congenital heart defects in this patient population. - Serial growth ultrasounds every 4 weeks starting at 24 to 28 weeks until delivery. - Antenatal testing (usually  weekly BPP or NST) weekly at 32 weeks until delivery. - Delivery likely around *** weeks gestation or sooner if indicated.  Review of Systems: A review of systems was performed and was negative except per HPI   Past Obstetrical History:  OB History  Gravida Para Term Preterm AB Living  12 3 2 1 8 4   SAB IAB Ectopic Multiple Live Births  7 0 1 1 4     # Outcome Date GA Lbr Len/2nd Weight  Sex Type Anes PTL Lv  12 Current           11 Term 09/05/16 [redacted]w[redacted]d 06:25 / 00:09 5 lb 14.8 oz (2.688 kg) F Vag-Spont EPI  LIV  10A Preterm 11/05/11 [redacted]w[redacted]d 51:40 / 00:07 5 lb 1.5 oz (2.31 kg) M Vag-Spont EPI, Local  LIV  10B Preterm 11/05/11 [redacted]w[redacted]d 51:40 / 00:09 4 lb 3.4 oz (1.91 kg) F Vag-Spont Local, EPI  LIV  9 Term 07/08/06    F Vag-Spont   LIV  8 SAB           7 SAB           6 SAB           5 SAB           4 SAB           3 SAB           2 SAB           1 Ectopic              Past Medical History:  Past Medical History:  Diagnosis Date   Anxiety    Depression    Goiter    history of Anemia    PIH (pregnancy induced hypertension) 09/05/2016   PONV (postoperative nausea and vomiting)    Post-operative state 12/18/2022   Postoperative state 12/16/2022   Preterm labor    Retained products of conception after miscarriage 12/17/2022   Spontaneous vaginal delivery 09/05/2016   Threatened preterm labor, third trimester 08/14/2016     Past Surgical History:    Past Surgical History:  Procedure Laterality Date   APPENDECTOMY  2008   DILATION AND CURETTAGE OF UTERUS     DILATION AND EVACUATION N/A 12/16/2022   Procedure: DILATATION AND EVACUATION;  Surgeon: Matt Song, MD;  Location: [facility] ;  Service: Gynecology;  Laterality: N/A;   DILATION AND EVACUATION N/A 12/18/2022   Procedure: DILATATION AND EVACUATION;  Surgeon: Matt Song, MD;  Location: [facility];  Service: Gynecology;  Laterality: N/A;   ECTOPIC PREGNANCY SURGERY  2007   LAPAROSCOPY FOR ECTOPIC PREGNANCY     VAGINAL DELIVERY  11/05/2011   Procedure: VAGINAL DELIVERY;  Surgeon: Hamp Levine, MD;  Location: [facility];  Service: Gynecology;  Laterality: N/A;     Home Medications:   Current Outpatient Medications on File Prior to Visit  Medication Sig Dispense Refill   buPROPion  (WELLBUTRIN  XL) 150 MG 24 hr tablet Take 150 mg by mouth daily.     cyclobenzaprine  (FLEXERIL ) 10 MG tablet  Take 1 tablet (10 mg total) by mouth 2 (two) times daily as needed for muscle spasms. 20 tablet 0   DULoxetine  (CYMBALTA ) 60 MG capsule Take 60 mg by mouth daily.     ondansetron  (ZOFRAN ) 4 MG tablet Take 1 tablet (4 mg total) by mouth every 6 (six) hours as needed for nausea. 20 tablet 0   pantoprazole  (PROTONIX ) 20 MG tablet Take 1  tablet (20 mg total) by mouth daily. 30 tablet 2   Prenatal Vit-Fe Fumarate-FA (MULTIVITAMIN-PRENATAL) 27-0.8 MG TABS tablet Take 1 tablet by mouth daily at 12 noon.     traZODone  (DESYREL ) 100 MG tablet Take 100 mg by mouth at bedtime.     amoxicillin  (AMOXIL ) 250 MG capsule Take 1 capsule (250 mg total) by mouth 3 (three) times daily. (Patient not taking: Reported on 12/15/2023) 15 capsule 0   cyclobenzaprine  (FLEXERIL ) 10 MG tablet Take 1 tablet (10 mg total) by mouth 3 (three) times daily as needed for muscle spasms. 30 tablet 2   magnesium  oxide (MAG-OX) 400 MG tablet Take 1 tablet (400 mg total) by mouth daily. (Patient not taking: Reported on 12/15/2023) 30 tablet 9   metoCLOPramide  (REGLAN ) 10 MG tablet Take 1 tablet (10 mg total) by mouth every 6 (six) hours as needed for nausea or vomiting. (Patient not taking: Reported on 12/15/2023) 20 tablet 0   scopolamine  (TRANSDERM-SCOP) 1 MG/3DAYS Place 1 patch (1.5 mg total) onto the skin every 3 (three) days. (Patient not taking: Reported on 12/15/2023) 10 patch 0   No current facility-administered medications on file prior to visit.      Allergies:   Allergies  Allergen Reactions   Chlorhexidine  Gluconate Itching     Physical Exam:   Vitals:   12/15/23 1456  BP: 118/83  Pulse: (!) 112   Sitting comfortably on the sonogram table Nonlabored breathing Normal rate and rhythm Abdomen is nontender  Thank you for the opportunity to be involved with this patient's care. Please let us  know if we can be of any further assistance.   *** minutes of time was spent reviewing the patient's chart including labs, imaging  and documentation.  At least 50% of this time was spent with direct patient care discussing the diagnosis, management and prognosis of her care.  Christine Greene MFM,    12/15/2023  3:57 PM

## 2023-12-17 ENCOUNTER — Encounter (HOSPITAL_COMMUNITY): Payer: Self-pay | Admitting: Obstetrics and Gynecology

## 2023-12-17 ENCOUNTER — Inpatient Hospital Stay (HOSPITAL_COMMUNITY)
Admission: AD | Admit: 2023-12-17 | Discharge: 2023-12-17 | Attending: Obstetrics and Gynecology | Admitting: Obstetrics and Gynecology

## 2023-12-17 DIAGNOSIS — O09522 Supervision of elderly multigravida, second trimester: Secondary | ICD-10-CM | POA: Insufficient documentation

## 2023-12-17 DIAGNOSIS — Z3A21 21 weeks gestation of pregnancy: Secondary | ICD-10-CM

## 2023-12-17 DIAGNOSIS — R030 Elevated blood-pressure reading, without diagnosis of hypertension: Secondary | ICD-10-CM | POA: Diagnosis not present

## 2023-12-17 DIAGNOSIS — O4692 Antepartum hemorrhage, unspecified, second trimester: Secondary | ICD-10-CM | POA: Diagnosis present

## 2023-12-17 DIAGNOSIS — R102 Pelvic and perineal pain: Secondary | ICD-10-CM | POA: Diagnosis present

## 2023-12-17 DIAGNOSIS — O42912 Preterm premature rupture of membranes, unspecified as to length of time between rupture and onset of labor, second trimester: Secondary | ICD-10-CM | POA: Diagnosis not present

## 2023-12-17 DIAGNOSIS — O26892 Other specified pregnancy related conditions, second trimester: Secondary | ICD-10-CM | POA: Insufficient documentation

## 2023-12-17 DIAGNOSIS — O4702 False labor before 37 completed weeks of gestation, second trimester: Secondary | ICD-10-CM

## 2023-12-17 DIAGNOSIS — Z8759 Personal history of other complications of pregnancy, childbirth and the puerperium: Secondary | ICD-10-CM

## 2023-12-17 LAB — CBC WITH DIFFERENTIAL/PLATELET
Abs Immature Granulocytes: 0.06 10*3/uL (ref 0.00–0.07)
Basophils Absolute: 0 10*3/uL (ref 0.0–0.1)
Basophils Relative: 0 %
Eosinophils Absolute: 0.1 10*3/uL (ref 0.0–0.5)
Eosinophils Relative: 1 %
HCT: 35.4 % — ABNORMAL LOW (ref 36.0–46.0)
Hemoglobin: 12.1 g/dL (ref 12.0–15.0)
Immature Granulocytes: 1 %
Lymphocytes Relative: 17 %
Lymphs Abs: 1.8 10*3/uL (ref 0.7–4.0)
MCH: 30.9 pg (ref 26.0–34.0)
MCHC: 34.2 g/dL (ref 30.0–36.0)
MCV: 90.3 fL (ref 80.0–100.0)
Monocytes Absolute: 0.8 10*3/uL (ref 0.1–1.0)
Monocytes Relative: 7 %
Neutro Abs: 7.4 10*3/uL (ref 1.7–7.7)
Neutrophils Relative %: 74 %
Platelets: 196 10*3/uL (ref 150–400)
RBC: 3.92 MIL/uL (ref 3.87–5.11)
RDW: 14.3 % (ref 11.5–15.5)
WBC: 10.1 10*3/uL (ref 4.0–10.5)
nRBC: 0.2 % (ref 0.0–0.2)

## 2023-12-17 LAB — TYPE AND SCREEN
ABO/RH(D): B NEG
Antibody Screen: NEGATIVE

## 2023-12-17 MED ORDER — ONDANSETRON 4 MG PO TBDP
8.0000 mg | ORAL_TABLET | Freq: Once | ORAL | Status: AC
  Administered 2023-12-17: 8 mg via ORAL
  Filled 2023-12-17: qty 2

## 2023-12-17 NOTE — MAU Provider Note (Signed)
 VB    S Ms. ELDENA DEDE is a 38 y.o. [id number] pregnant female at [redacted]w[redacted]d who presents to MAU today with complaint of VB.  Confirmed PPROM @19wk  with plan to receive BMX at 22wk5day while continue latency abx.  However, started bleeding around 1630 today.  Having cramping abdominal pain with more pressure within her pelvis.   Receives care at [facility]. Prenatal records reviewed.  Pertinent items noted in HPI and remainder of comprehensive ROS otherwise negative.   O BP (!) 154/93 (BP Location: Right Arm)   Pulse (!) 112   Temp 98.6 F (37 C) (Oral)   Resp 18   LMP 07/19/2023   SpO2 100%  Physical Exam Vitals and nursing note reviewed. Exam conducted with a chaperone present.  Constitutional:      General: She is not in acute distress.    Appearance: She is obese. She is not ill-appearing.     Comments: Visible upset  HENT:     Head: Normocephalic and atraumatic.     Right Ear: External ear normal.     Left Ear: External ear normal.     Nose: Nose normal. No congestion.     Mouth/Throat:     Mouth: Mucous membranes are moist.     Pharynx: Oropharynx is clear.   Eyes:     Extraocular Movements: Extraocular movements intact.     Conjunctiva/sclera: Conjunctivae normal.    Cardiovascular:     Rate and Rhythm: Normal rate.  Pulmonary:     Effort: Pulmonary effort is normal. No respiratory distress.  Abdominal:     General: There is no distension.     Palpations: Abdomen is soft.     Tenderness: There is no abdominal tenderness.  Genitourinary:    General: Normal vulva.     Vagina: Vaginal discharge present.     Comments: Sterile speculum with what appeared to be 1cm dilated cervix, dark red pooling blood in posterior fornix w/o active bleeding from external os  Musculoskeletal:        General: No swelling. Normal range of motion.     Cervical back: Normal range of motion.   Skin:    General: Skin is warm and dry.   Neurological:     Mental Status: She is alert  and oriented to person, place, and time. Mental status is at baseline.     Motor: No weakness.     Gait: Gait normal.   Psychiatric:     Comments: Tearful and concerned about the baby insisting on full resuscitative measures   Pt informed that the ultrasound is considered a limited OB ultrasound and is not intended to be a complete ultrasound exam.  Patient also informed that the ultrasound is not being completed with the intent of assessing for fetal or placental anomalies or any pelvic abnormalities.  Explained that the purpose of today's ultrasound is to assess for  presentation and viability.  Patient acknowledges the purpose of the exam and the limitations of the study.    My interpretation: FHT 159bpm, cephalic, appears that both internal and external os slightly dilated     MDM: MAU Course: Pt visibly upset with elevated BP.  BSUS as above and sterile speculum concerning for dilation and pooling blood products w/o clear evidence of blood from os.  Called made to both [facility] for consideration of transfer given red NICU, patient's wishes of full resuscitation and threatening exam.  Dr. Camilo Cella with MFM at [facility] called back after [facility]  covering provider Dr. Maurie Southern assumed care but told both that she would check with their NICU and call Dr. Maurie Southern back.  Sign out to Dr. Maurie Southern.     AP #[redacted] weeks gestation #Hx PPROM 19 weeks  #Threatened PTL - Dr. Maurie Southern assuming care     Ebony Goldstein, MD 12/17/2023 6:29 PM

## 2023-12-17 NOTE — MAU Provider Note (Signed)
 S [name] is a 38 y.o. [id number] pregnant female at [redacted]w[redacted]d who presents to MAU today with complaint of VB.  Confirmed PPROM @[redacted]w[redacted]d  with plan to re-admit at [redacted]w[redacted]d to receive betamethasone . She reports that she started to have some vaginal bleeding around 16:30 today.  Having mild cramping abdominal pain with more pressure within her pelvis. She is visibly upset and very worried about all resuscitation efforts for her baby.     Pertinent items noted in HPI and remainder of comprehensive ROS otherwise negative.    O BP 133/78 (BP Location: Right Arm)   Pulse 97   Temp 98.6 F (37 C) (Oral)   Resp 16   LMP 07/19/2023   SpO2 100%  Physical Exam Vitals and nursing note reviewed. Exam conducted with a chaperone present.  Constitutional:      General: She is not in acute distress.    Appearance: She is obese. She is not ill-appearing.     Comments: Visibly upset  HENT:     Head: Normocephalic and atraumatic.     Right Ear: External ear normal.     Left Ear: External ear normal.     Nose: Nose normal. No congestion.     Mouth/Throat:     Mouth: Mucous membranes are moist.     Pharynx: Oropharynx is clear.    Eyes:     Extraocular Movements: Extraocular movements intact.     Conjunctiva/sclera: Conjunctivae normal.     Cardiovascular:     Rate and Rhythm: Normal rate.  Well perfused Pulmonary:     Effort: Pulmonary effort is normal. No respiratory distress.  Abdominal:     General: There is no distension.     Palpations: Abdomen is soft.     Tenderness: There is no abdominal tenderness.  Genitourinary:    General: Normal vulva.  Per MAU MD    Vagina: Vaginal discharge present.     Comments: Sterile speculum with what appeared to be 1cm dilated cervix, dark red pooling blood in posterior fornix w/o active bleeding from external os   Musculoskeletal:        General: No swelling. Normal range of motion.     Cervical back: Normal range of motion.    Skin:     General: Skin is warm and dry.    Neurological:     Mental Status: She is alert and oriented to person, place, and time. Mental status is at baseline.     Motor: No weakness.     Gait: Gait normal.    Psychiatric:     Comments: Tearful and concerned about the baby insisting on full resuscitative measures    Limited bedside US  performed by MAU MD: FHT 159bpm, cephalic, appears that both internal and external os slightly dilated    Lab: Hgb 12.1     A/P #[redacted] weeks gestation #Hx PPROM 19 weeks  #Threatened PTL  Patient strongly desires transfer to another facility that will offer resuscitation to baby at [redacted]w[redacted]d. Although she is stable and comfortable appearing, we discussed the small risk of delivery during transport, she voiced understanding. Per Dr. Camilo Cella, the accepting physician at [facility], BMZ will be offered at midnight. No tocolytic will be offered initially in the setting of vaginal bleeding. No monitoring of baby or C-section will be offered until the hospital's gestational age of viability. Patient voiced understanding and strongly desires transfer.  Alroy Jericho, MD 12/17/2023 7:49pm

## 2023-12-17 NOTE — MAU Note (Signed)
 Christine Greene is a 38 y.o. at [redacted]w[redacted]d here in MAU reporting: she started bleeding at 1630, reports known ROM at 19 weeks, was admitted for iv antibiotic. Onset of complaint: 1630 Pain score: 0/10 Vitals:   12/17/23 1746  BP: (!) 154/93  Pulse: (!) 112     FHT: 159  Lab orders placed from triag

## 2024-04-19 ENCOUNTER — Other Ambulatory Visit (HOSPITAL_BASED_OUTPATIENT_CLINIC_OR_DEPARTMENT_OTHER): Payer: Self-pay | Admitting: Otolaryngology

## 2024-04-19 ENCOUNTER — Other Ambulatory Visit: Payer: Self-pay | Admitting: Otolaryngology

## 2024-04-19 DIAGNOSIS — R0601 Orthopnea: Secondary | ICD-10-CM

## 2024-04-19 DIAGNOSIS — E042 Nontoxic multinodular goiter: Secondary | ICD-10-CM

## 2024-04-19 DIAGNOSIS — R1314 Dysphagia, pharyngoesophageal phase: Secondary | ICD-10-CM

## 2024-04-26 ENCOUNTER — Inpatient Hospital Stay (HOSPITAL_BASED_OUTPATIENT_CLINIC_OR_DEPARTMENT_OTHER): Admission: RE | Admit: 2024-04-26 | Admitting: Radiology

## 2024-04-26 ENCOUNTER — Other Ambulatory Visit (HOSPITAL_BASED_OUTPATIENT_CLINIC_OR_DEPARTMENT_OTHER): Admitting: Radiology

## 2024-05-07 ENCOUNTER — Inpatient Hospital Stay (HOSPITAL_BASED_OUTPATIENT_CLINIC_OR_DEPARTMENT_OTHER): Admission: RE | Admit: 2024-05-07 | Admitting: Radiology

## 2024-05-07 ENCOUNTER — Other Ambulatory Visit (HOSPITAL_BASED_OUTPATIENT_CLINIC_OR_DEPARTMENT_OTHER): Admitting: Radiology

## 2024-05-21 ENCOUNTER — Inpatient Hospital Stay (HOSPITAL_BASED_OUTPATIENT_CLINIC_OR_DEPARTMENT_OTHER): Admission: RE | Admit: 2024-05-21 | Source: Ambulatory Visit | Admitting: Radiology

## 2024-05-21 ENCOUNTER — Encounter (HOSPITAL_BASED_OUTPATIENT_CLINIC_OR_DEPARTMENT_OTHER): Payer: Self-pay | Admitting: Radiology

## 2024-06-04 ENCOUNTER — Encounter (HOSPITAL_BASED_OUTPATIENT_CLINIC_OR_DEPARTMENT_OTHER): Payer: Self-pay

## 2024-06-04 ENCOUNTER — Encounter (HOSPITAL_BASED_OUTPATIENT_CLINIC_OR_DEPARTMENT_OTHER): Payer: Self-pay | Admitting: *Deleted

## 2024-06-04 ENCOUNTER — Inpatient Hospital Stay (HOSPITAL_BASED_OUTPATIENT_CLINIC_OR_DEPARTMENT_OTHER): Admission: RE | Admit: 2024-06-04 | Source: Ambulatory Visit | Admitting: Radiology
